# Patient Record
Sex: Female | Born: 1940 | Race: White | Hispanic: No | Marital: Married | Smoking: Never smoker
Health system: Southern US, Community
[De-identification: ages and names within clinical notes are randomized; demographics above are authoritative.]

## PROBLEM LIST (undated history)

## (undated) DIAGNOSIS — E7800 Pure hypercholesterolemia, unspecified: Secondary | ICD-10-CM

## (undated) DIAGNOSIS — T7840XA Allergy, unspecified, initial encounter: Secondary | ICD-10-CM

## (undated) DIAGNOSIS — Z5189 Encounter for other specified aftercare: Secondary | ICD-10-CM

## (undated) DIAGNOSIS — I10 Essential (primary) hypertension: Secondary | ICD-10-CM

## (undated) DIAGNOSIS — I639 Cerebral infarction, unspecified: Secondary | ICD-10-CM

## (undated) DIAGNOSIS — M810 Age-related osteoporosis without current pathological fracture: Secondary | ICD-10-CM

## (undated) HISTORY — DX: Essential (primary) hypertension: I10

## (undated) HISTORY — DX: Allergy, unspecified, initial encounter: T78.40XA

## (undated) HISTORY — DX: Encounter for other specified aftercare: Z51.89

## (undated) HISTORY — DX: Age-related osteoporosis without current pathological fracture: M81.0

## (undated) HISTORY — PX: VAGINAL HYSTERECTOMY: SUR661

---
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
## 1961-02-11 HISTORY — PX: APPENDECTOMY: SHX54

## 1972-02-12 HISTORY — PX: TUBAL LIGATION: SHX77

## 1997-09-02 ENCOUNTER — Ambulatory Visit (HOSPITAL_COMMUNITY): Admission: RE | Admit: 1997-09-02 | Discharge: 1997-09-02 | Payer: Self-pay | Admitting: Obstetrics & Gynecology

## 1998-09-05 ENCOUNTER — Ambulatory Visit (HOSPITAL_COMMUNITY): Admission: RE | Admit: 1998-09-05 | Discharge: 1998-09-05 | Payer: Self-pay | Admitting: Obstetrics & Gynecology

## 1998-09-05 ENCOUNTER — Encounter: Payer: Self-pay | Admitting: Obstetrics & Gynecology

## 1998-10-20 ENCOUNTER — Other Ambulatory Visit: Admission: RE | Admit: 1998-10-20 | Discharge: 1998-10-20 | Payer: Self-pay | Admitting: Obstetrics & Gynecology

## 1998-10-26 ENCOUNTER — Encounter: Payer: Self-pay | Admitting: Obstetrics & Gynecology

## 1998-10-26 ENCOUNTER — Ambulatory Visit (HOSPITAL_COMMUNITY): Admission: RE | Admit: 1998-10-26 | Discharge: 1998-10-26 | Payer: Self-pay | Admitting: Obstetrics & Gynecology

## 1999-09-18 ENCOUNTER — Encounter: Payer: Self-pay | Admitting: Obstetrics & Gynecology

## 1999-09-18 ENCOUNTER — Ambulatory Visit (HOSPITAL_COMMUNITY): Admission: RE | Admit: 1999-09-18 | Discharge: 1999-09-18 | Payer: Self-pay | Admitting: Obstetrics & Gynecology

## 1999-11-09 ENCOUNTER — Other Ambulatory Visit: Admission: RE | Admit: 1999-11-09 | Discharge: 1999-11-09 | Payer: Self-pay | Admitting: Obstetrics & Gynecology

## 2000-09-22 ENCOUNTER — Ambulatory Visit (HOSPITAL_COMMUNITY): Admission: RE | Admit: 2000-09-22 | Discharge: 2000-09-22 | Payer: Self-pay | Admitting: Obstetrics & Gynecology

## 2000-09-22 ENCOUNTER — Encounter: Payer: Self-pay | Admitting: Obstetrics & Gynecology

## 2001-04-08 ENCOUNTER — Other Ambulatory Visit: Admission: RE | Admit: 2001-04-08 | Discharge: 2001-04-08 | Payer: Self-pay | Admitting: Obstetrics & Gynecology

## 2002-07-19 ENCOUNTER — Other Ambulatory Visit: Admission: RE | Admit: 2002-07-19 | Discharge: 2002-07-19 | Payer: Self-pay | Admitting: Obstetrics & Gynecology

## 2003-07-22 ENCOUNTER — Other Ambulatory Visit: Admission: RE | Admit: 2003-07-22 | Discharge: 2003-07-22 | Payer: Self-pay | Admitting: Obstetrics & Gynecology

## 2004-10-26 ENCOUNTER — Ambulatory Visit (HOSPITAL_COMMUNITY): Admission: RE | Admit: 2004-10-26 | Discharge: 2004-10-26 | Payer: Self-pay | Admitting: Obstetrics & Gynecology

## 2004-10-26 ENCOUNTER — Other Ambulatory Visit: Admission: RE | Admit: 2004-10-26 | Discharge: 2004-10-26 | Payer: Self-pay | Admitting: Obstetrics & Gynecology

## 2006-01-29 ENCOUNTER — Ambulatory Visit (HOSPITAL_COMMUNITY): Admission: RE | Admit: 2006-01-29 | Discharge: 2006-01-29 | Payer: Self-pay | Admitting: Obstetrics & Gynecology

## 2010-10-19 ENCOUNTER — Ambulatory Visit
Admission: RE | Admit: 2010-10-19 | Discharge: 2010-10-19 | Disposition: A | Discharge status: Home / Self Care | Payer: Medicare Other | Source: Ambulatory Visit | Attending: Family Medicine | Admitting: Family Medicine

## 2010-10-19 ENCOUNTER — Other Ambulatory Visit: Payer: Self-pay | Admitting: Family Medicine

## 2010-10-19 DIAGNOSIS — J40 Bronchitis, not specified as acute or chronic: Secondary | ICD-10-CM

## 2010-10-19 DIAGNOSIS — R05 Cough: Secondary | ICD-10-CM

## 2011-05-03 DIAGNOSIS — J309 Allergic rhinitis, unspecified: Secondary | ICD-10-CM | POA: Diagnosis not present

## 2011-05-03 DIAGNOSIS — I10 Essential (primary) hypertension: Secondary | ICD-10-CM | POA: Diagnosis not present

## 2011-05-03 DIAGNOSIS — B9789 Other viral agents as the cause of diseases classified elsewhere: Secondary | ICD-10-CM | POA: Diagnosis not present

## 2011-05-13 DIAGNOSIS — R030 Elevated blood-pressure reading, without diagnosis of hypertension: Secondary | ICD-10-CM | POA: Diagnosis not present

## 2011-05-13 DIAGNOSIS — B9789 Other viral agents as the cause of diseases classified elsewhere: Secondary | ICD-10-CM | POA: Diagnosis not present

## 2011-05-31 DIAGNOSIS — I10 Essential (primary) hypertension: Secondary | ICD-10-CM | POA: Diagnosis not present

## 2011-05-31 DIAGNOSIS — R5383 Other fatigue: Secondary | ICD-10-CM | POA: Diagnosis not present

## 2011-05-31 DIAGNOSIS — R5381 Other malaise: Secondary | ICD-10-CM | POA: Diagnosis not present

## 2011-05-31 DIAGNOSIS — R05 Cough: Secondary | ICD-10-CM | POA: Diagnosis not present

## 2011-06-05 DIAGNOSIS — R6889 Other general symptoms and signs: Secondary | ICD-10-CM | POA: Diagnosis not present

## 2011-06-05 DIAGNOSIS — D649 Anemia, unspecified: Secondary | ICD-10-CM | POA: Diagnosis not present

## 2011-06-19 DIAGNOSIS — E7800 Pure hypercholesterolemia, unspecified: Secondary | ICD-10-CM | POA: Diagnosis not present

## 2011-06-19 DIAGNOSIS — I10 Essential (primary) hypertension: Secondary | ICD-10-CM | POA: Diagnosis not present

## 2011-06-19 DIAGNOSIS — S9030XA Contusion of unspecified foot, initial encounter: Secondary | ICD-10-CM | POA: Diagnosis not present

## 2011-09-21 DIAGNOSIS — M359 Systemic involvement of connective tissue, unspecified: Secondary | ICD-10-CM | POA: Diagnosis not present

## 2011-11-18 DIAGNOSIS — E7800 Pure hypercholesterolemia, unspecified: Secondary | ICD-10-CM | POA: Diagnosis not present

## 2011-11-18 DIAGNOSIS — M79609 Pain in unspecified limb: Secondary | ICD-10-CM | POA: Diagnosis not present

## 2011-11-18 DIAGNOSIS — Z23 Encounter for immunization: Secondary | ICD-10-CM | POA: Diagnosis not present

## 2011-12-06 DIAGNOSIS — J0100 Acute maxillary sinusitis, unspecified: Secondary | ICD-10-CM | POA: Diagnosis not present

## 2011-12-06 DIAGNOSIS — J069 Acute upper respiratory infection, unspecified: Secondary | ICD-10-CM | POA: Diagnosis not present

## 2012-07-24 DIAGNOSIS — I10 Essential (primary) hypertension: Secondary | ICD-10-CM | POA: Diagnosis not present

## 2012-07-24 DIAGNOSIS — E7800 Pure hypercholesterolemia, unspecified: Secondary | ICD-10-CM | POA: Diagnosis not present

## 2012-10-14 DIAGNOSIS — I10 Essential (primary) hypertension: Secondary | ICD-10-CM | POA: Diagnosis not present

## 2012-10-14 DIAGNOSIS — R232 Flushing: Secondary | ICD-10-CM | POA: Diagnosis not present

## 2012-10-14 DIAGNOSIS — E7800 Pure hypercholesterolemia, unspecified: Secondary | ICD-10-CM | POA: Diagnosis not present

## 2013-01-26 DIAGNOSIS — N959 Unspecified menopausal and perimenopausal disorder: Secondary | ICD-10-CM | POA: Diagnosis not present

## 2013-01-26 DIAGNOSIS — Z124 Encounter for screening for malignant neoplasm of cervix: Secondary | ICD-10-CM | POA: Diagnosis not present

## 2013-01-26 DIAGNOSIS — M810 Age-related osteoporosis without current pathological fracture: Secondary | ICD-10-CM | POA: Diagnosis not present

## 2013-01-26 DIAGNOSIS — Z130 Encounter for screening for diseases of the blood and blood-forming organs and certain disorders involving the immune mechanism: Secondary | ICD-10-CM | POA: Diagnosis not present

## 2013-01-26 DIAGNOSIS — Z1212 Encounter for screening for malignant neoplasm of rectum: Secondary | ICD-10-CM | POA: Diagnosis not present

## 2013-01-26 DIAGNOSIS — Z1231 Encounter for screening mammogram for malignant neoplasm of breast: Secondary | ICD-10-CM | POA: Diagnosis not present

## 2013-02-01 ENCOUNTER — Encounter: Payer: Self-pay | Admitting: Internal Medicine

## 2013-03-10 ENCOUNTER — Ambulatory Visit (AMBULATORY_SURGERY_CENTER): Payer: Self-pay | Admitting: *Deleted

## 2013-03-10 VITALS — Ht 62.5 in | Wt 133.4 lb

## 2013-03-10 DIAGNOSIS — Z1211 Encounter for screening for malignant neoplasm of colon: Secondary | ICD-10-CM

## 2013-03-10 MED ORDER — MOVIPREP 100 G PO SOLR: ORAL | Status: DC

## 2013-03-10 NOTE — Progress Notes (Signed)
No allergies to eggs or soy. No problems with anesthesia.  

## 2013-03-16 ENCOUNTER — Encounter: Payer: Self-pay | Admitting: Internal Medicine

## 2013-03-24 ENCOUNTER — Ambulatory Visit (AMBULATORY_SURGERY_CENTER): Payer: Medicare Other | Admitting: Internal Medicine

## 2013-03-24 ENCOUNTER — Encounter: Payer: Self-pay | Admitting: Internal Medicine

## 2013-03-24 VITALS — BP 143/94 | HR 53 | Temp 98.7°F | Resp 18 | Ht 62.0 in | Wt 133.0 lb

## 2013-03-24 DIAGNOSIS — D126 Benign neoplasm of colon, unspecified: Secondary | ICD-10-CM

## 2013-03-24 DIAGNOSIS — Z1211 Encounter for screening for malignant neoplasm of colon: Secondary | ICD-10-CM

## 2013-03-24 MED ORDER — SODIUM CHLORIDE 0.9 % IV SOLN: 500.0000 mL | INTRAVENOUS | Status: DC

## 2013-03-24 NOTE — Op Note (Signed)
[facility]  [address] [place], [id number]   COLONOSCOPY PROCEDURE REPORT  PATIENT: Kimberly, Woods  MR#: [id number] BIRTHDATE: [birth year] , 32  yrs. old GENDER: Female ENDOSCOPIST: Lafayette Dragon, MD REFERRED UU:VOZDGU Nori Riis, M.D. PROCEDURE DATE:  03/24/2013 PROCEDURE:   Colonoscopy, screening First Screening Colonoscopy - Avg.  risk and is 50 yrs.  old or older - No.  Prior Negative Screening - Now for repeat screening. 10 or more years since last screening  History of Adenoma - Now for follow-up colonoscopy & has been > or = to 3 yrs.  N/A  Polyps Removed Today? Yes. ASA CLASS:   Class II INDICATIONS:Average risk patient for colon cancer and last screening colonoscopy approximately 10 years ago was normal. MEDICATIONS: MAC sedation, administered by CRNA and propofol (Diprivan) 300mg  IV  DESCRIPTION OF PROCEDURE:   After the risks benefits and alternatives of the procedure were thoroughly explained, informed consent was obtained.  A digital rectal exam revealed no abnormalities of the rectum.   The LB [id number]  endoscope was introduced through the anus and advanced to the cecum, which was identified by both the appendix and ileocecal valve. No adverse events experienced.   The quality of the prep was good, using MoviPrep  The instrument was then slowly withdrawn as the colon was fully examined.      COLON FINDINGS: A diminutive sessile polyp was found in the ascending colon.  A polypectomy was performed with cold forceps. The resection was complete and the polyp tissue was completely retrieved.  Retroflexed views revealed no abnormalities. The time to cecum=7 minutes 09 seconds.  Withdrawal time=7 minutes 54 seconds.  The scope was withdrawn and the procedure completed. COMPLICATIONS: There were no complications.  ENDOSCOPIC IMPRESSION: Diminutive sessile polyp was found in the ascending colon; polypectomy was performed with cold  forceps  RECOMMENDATIONS: 1.  Await pathology results 2.  hhigh fiber diet Recall colonoscopy pending path report   eSigned:  Lafayette Dragon, MD 03/24/2013 3:26 PM   cc:   PATIENT NAME:  Kimberly, Woods MR#: [id number]

## 2013-03-24 NOTE — Patient Instructions (Signed)
YOU HAD AN ENDOSCOPIC PROCEDURE TODAY AT THE [facility]: Refer to the procedure report that was given to you for any specific questions about what was found during the examination.  If the procedure report does not answer your questions, please call your gastroenterologist to clarify.  If you requested that your care partner not be given the details of your procedure findings, then the procedure report has been included in a sealed envelope for you to review at your convenience later.  YOU SHOULD EXPECT: Some feelings of bloating in the abdomen. Passage of more gas than usual.  Walking can help get rid of the air that was put into your GI tract during the procedure and reduce the bloating. If you had a lower endoscopy (such as a colonoscopy or flexible sigmoidoscopy) you may notice spotting of blood in your stool or on the toilet paper. If you underwent a bowel prep for your procedure, then you may not have a normal bowel movement for a few days.  DIET: Your first meal following the procedure should be a light meal and then it is ok to progress to your normal diet.  A half-sandwich or bowl of soup is an example of a good first meal.  Heavy or fried foods are harder to digest and may make you feel nauseous or bloated.  Likewise meals heavy in dairy and vegetables can cause extra gas to form and this can also increase the bloating.  Drink plenty of fluids but you should avoid alcoholic beverages for 24 hours.  ACTIVITY: Your care partner should take you home directly after the procedure.  You should plan to take it easy, moving slowly for the rest of the day.  You can resume normal activity the day after the procedure however you should NOT DRIVE or use heavy machinery for 24 hours (because of the sedation medicines used during the test).    SYMPTOMS TO REPORT IMMEDIATELY: A gastroenterologist can be reached at any hour.  During normal business hours, 8:30 AM to 5:00 PM Monday through Friday,  call [phone].  After hours and on weekends, please call the GI answering service at [phone] who will take a message and have the physician on call contact you.   Following lower endoscopy (colonoscopy or flexible sigmoidoscopy):  Excessive amounts of blood in the stool  Significant tenderness or worsening of abdominal pains  Swelling of the abdomen that is new, acute  Fever of 100F or higher    FOLLOW UP: If any biopsies were taken you will be contacted by phone or by letter within the next 1-3 weeks.  Call your gastroenterologist if you have not heard about the biopsies in 3 weeks.  Our staff will call the home number listed on your records the next business day following your procedure to check on you and address any questions or concerns that you may have at that time regarding the information given to you following your procedure. This is a courtesy call and so if there is no answer at the home number and we have not heard from you through the emergency physician on call, we will assume that you have returned to your regular daily activities without incident.  SIGNATURES/CONFIDENTIALITY: You and/or your care partner have signed paperwork which will be entered into your electronic medical record.  These signatures attest to the fact that that the information above on your After Visit Summary has been reviewed and is understood.  Full responsibility of the confidentiality   of this discharge information lies with you and/or your care-partner.   INFORMATION ON POLYPS AND HIGH FIBER DIET GIVEN TO YOU TODAY 

## 2013-03-24 NOTE — Progress Notes (Signed)
Called to room to assist during endoscopic procedure.  Patient ID and intended procedure confirmed with present staff. Received instructions for my participation in the procedure from the performing physician.  

## 2013-03-25 ENCOUNTER — Telehealth: Payer: Self-pay | Admitting: *Deleted

## 2013-03-25 NOTE — Telephone Encounter (Signed)
  Follow up Call-  Call back number 03/24/2013  Post procedure Call Back phone  # [phone]  Permission to leave phone message Yes     Patient questions:  Do you have a fever, pain , or abdominal swelling? no Pain Score  0 *  Have you tolerated food without any problems? yes  Have you been able to return to your normal activities? yes  Do you have any questions about your discharge instructions: Diet   no Medications  no Follow up visit  no  Do you have questions or concerns about your Care? no  Actions: * If pain score is 4 or above: No action needed, pain <4.

## 2013-03-29 ENCOUNTER — Encounter: Payer: Self-pay | Admitting: Internal Medicine

## 2013-03-31 ENCOUNTER — Encounter: Payer: Self-pay | Admitting: *Deleted

## 2013-04-20 ENCOUNTER — Ambulatory Visit (HOSPITAL_COMMUNITY)
Admission: RE | Admit: 2013-04-20 | Discharge: 2013-04-20 | Disposition: A | Discharge status: Home / Self Care | Payer: Medicare Other | Source: Ambulatory Visit | Attending: *Deleted | Admitting: *Deleted

## 2013-04-20 ENCOUNTER — Other Ambulatory Visit (HOSPITAL_COMMUNITY): Payer: Self-pay | Admitting: *Deleted

## 2013-04-20 DIAGNOSIS — W19XXXA Unspecified fall, initial encounter: Secondary | ICD-10-CM

## 2013-04-20 DIAGNOSIS — J9819 Other pulmonary collapse: Secondary | ICD-10-CM | POA: Insufficient documentation

## 2013-04-20 DIAGNOSIS — R079 Chest pain, unspecified: Secondary | ICD-10-CM | POA: Insufficient documentation

## 2013-04-20 DIAGNOSIS — I10 Essential (primary) hypertension: Secondary | ICD-10-CM | POA: Insufficient documentation

## 2013-10-20 MED ADMIN — LORazepam (ATIVAN) tablet 0.5 mg: ORAL | @ 18:00:00 | NDC 68084073611

## 2013-10-20 MED FILL — LORAZEPAM 0.5 MG TAB: 0.5 mg | ORAL | Qty: 1

## 2013-10-20 NOTE — ED Notes (Signed)
Discharged by Provider.

## 2013-10-20 NOTE — ED Provider Notes (Signed)
HPI Comments: Pt reports that 2 nights ago her granddaughter "popped" up in bed and caused her to fall back and hit her right occiput on the bedframe. No LOC. Last night, pt was in the bathroom and slipped on spilt toilet water (from plunging) and his her forehead on the bathtub.  Today pt has a vague visual disturbance in R>L eye. Denies blurry vision or FB. States she sees a reflection of herself just in front of her right eye. Even when her eye is closed    Patient is a 73 y.o. female presenting with head injury. The history is provided by the patient.   Head Injury   The incident occurred more than 2 days ago. She came to the ER via walk-in. The injury mechanism was a direct blow. The volume of blood lost was none. The quality of the pain is described as throbbing. The pain is mild. Pertinent negatives include no numbness and no weakness. Associated symptoms comments: Visual changes. She has tried nothing for the symptoms. There was no loss of consciousness.        Past Medical History   Diagnosis Date   ??? Hypertension         Past Surgical History   Procedure Laterality Date   ??? Hx hysteroscopy with endometrial ablation     ??? Hx tubal ligation     ??? Hx appendectomy     ??? Hx heent       tonsillectomy         No family history on file.     History     Social History   ??? Marital Status: MARRIED     Spouse Name: N/A     Number of Children: N/A   ??? Years of Education: N/A     Occupational History   ??? Not on file.     Social History Main Topics   ??? Smoking status: Not on file   ??? Smokeless tobacco: Not on file   ??? Alcohol Use: Not on file   ??? Drug Use: Not on file   ??? Sexual Activity: Not on file     Other Topics Concern   ??? Not on file     Social History Narrative   ??? No narrative on file                  ALLERGIES: Review of patient's allergies indicates no known allergies.      Review of Systems   Constitutional: Positive for activity change. Negative for fever, chills, diaphoresis, appetite change and fatigue.    HENT: Positive for facial swelling. Negative for congestion, dental problem, ear discharge and ear pain.    Eyes: Positive for visual disturbance. Negative for photophobia, pain, discharge, redness and itching.   Respiratory: Negative for cough, shortness of breath and stridor.    Cardiovascular: Negative for chest pain, palpitations and leg swelling.   Gastrointestinal: Negative.    Genitourinary: Negative.    Musculoskeletal: Negative.    Neurological: Positive for headaches. Negative for dizziness, tremors, seizures, speech difficulty, weakness, light-headedness and numbness.       Filed Vitals:    10/20/13 1159 10/20/13 1200 10/20/13 1201   BP: 206/124 186/99 186/99   Pulse: 71     Temp:   98.2 ??F (36.8 ??C)   Resp: 16     Height:  (1.575 m)     Weight: 58.968 kg (130 lb)     SpO2: 96% 98%  Physical Exam   Constitutional: She is oriented to person, place, and time. She appears well-developed and well-nourished. No distress.   HENT:   Head: Normocephalic and atraumatic.   Right Ear: External ear normal.   Left Ear: External ear normal.   Mouth/Throat: Oropharynx is clear and moist. No oropharyngeal exudate.   Eyes: Conjunctivae are normal. Pupils are equal, round, and reactive to light. Right eye exhibits no discharge. Left eye exhibits no discharge.   Fundoscopic exam:       The right eye shows no AV nicking, no exudate, no hemorrhage and no papilledema.        The left eye shows no AV nicking, no exudate, no hemorrhage and no papilledema.   Neck: Normal range of motion.   Cardiovascular: Normal rate, regular rhythm and normal heart sounds.    No murmur heard.  Pulmonary/Chest: Effort normal and breath sounds normal. No respiratory distress. She has no wheezes. She has no rales.   Neurological: She is alert and oriented to person, place, and time.   Skin: Skin is warm and dry. She is not diaphoretic.   Psychiatric: Her mood appears anxious.        MDM    Procedures     D/W Attending. Agrees with plan.    3:05 pm Daryl from [facility] returned call. He decided that he needed to call on call physician and then would call back again      Daryl was paged and called back a total of 3 times between 1:15 and 4:10. He spoke to Dr Maren Reamer, then myself, then [place]. No appointment made. Spoke to main call center about 4:20 and in 5 minutes an appt for tomorrow at 4:10pm was made with Dr. Anabel Bene at [place] office.        CLINICAL IMPRESSION:  1. Visual changes    2. CHI (closed head injury), initial encounter          Plan  1.F/U with ophthalmology  Return to ED if sx's worsen    DISCHARGE NOTE:  7:52 PM  Octavio Matheney A. Delorise Royals, [place] spoke with Dianna Rossetti about sx, dx, tx, and rx with good understanding. Care plan outlined and precautions discussed. All pt???s questions and concerns were addressed.  Pt was instructed and agrees to f/u with Dr. Anabel Bene, as well as to return to ED upon further deterioration. Pt is ready to go home.

## 2013-10-20 NOTE — ED Notes (Signed)
Pt resting in room, pt alert and oriented x3, vs stable as noted, pt has no new complaints at this time. Call bell within reach, bed in low position and locked. Pt aware to call the RN on the call bell if needs anything. Pt verbalizes understanding.

## 2013-10-20 NOTE — ED Notes (Signed)
Pt instructed to follow up with PCP about BP. PA Carrie aware of pt's BP and states pt is okay to be discharged.

## 2013-10-20 NOTE — ED Notes (Addendum)
Head impact x2 within last two days.  No LOC reported.  Intermittent blurred vision since.

## 2013-10-20 NOTE — Other (Signed)
Chart accessed for order verification, acuity or bed planning disposition.

## 2013-10-20 NOTE — ED Notes (Signed)
Pt ambulated to restroom with minimal assistance, tolerated activity well, report back to Primary RN Corrie.

## 2014-01-27 DIAGNOSIS — Z1231 Encounter for screening mammogram for malignant neoplasm of breast: Secondary | ICD-10-CM | POA: Diagnosis not present

## 2014-02-16 DIAGNOSIS — I10 Essential (primary) hypertension: Secondary | ICD-10-CM | POA: Diagnosis not present

## 2014-02-16 DIAGNOSIS — R7301 Impaired fasting glucose: Secondary | ICD-10-CM | POA: Diagnosis not present

## 2014-02-16 DIAGNOSIS — E782 Mixed hyperlipidemia: Secondary | ICD-10-CM | POA: Diagnosis not present

## 2014-08-25 DIAGNOSIS — R7301 Impaired fasting glucose: Secondary | ICD-10-CM | POA: Diagnosis not present

## 2014-08-25 DIAGNOSIS — I10 Essential (primary) hypertension: Secondary | ICD-10-CM | POA: Diagnosis not present

## 2014-09-16 DIAGNOSIS — L259 Unspecified contact dermatitis, unspecified cause: Secondary | ICD-10-CM | POA: Diagnosis not present

## 2014-10-04 ENCOUNTER — Ambulatory Visit (HOSPITAL_COMMUNITY)
Admission: RE | Admit: 2014-10-04 | Discharge: 2014-10-04 | Disposition: A | Discharge status: Home / Self Care | Payer: Medicare Other | Source: Ambulatory Visit | Attending: Internal Medicine | Admitting: Internal Medicine

## 2014-10-04 ENCOUNTER — Other Ambulatory Visit (HOSPITAL_COMMUNITY): Payer: Self-pay | Admitting: Internal Medicine

## 2014-10-04 DIAGNOSIS — M79652 Pain in left thigh: Secondary | ICD-10-CM | POA: Diagnosis not present

## 2014-10-04 DIAGNOSIS — M79605 Pain in left leg: Secondary | ICD-10-CM | POA: Diagnosis not present

## 2014-10-04 DIAGNOSIS — R52 Pain, unspecified: Secondary | ICD-10-CM

## 2014-10-04 DIAGNOSIS — R609 Edema, unspecified: Secondary | ICD-10-CM

## 2014-10-04 DIAGNOSIS — M25562 Pain in left knee: Secondary | ICD-10-CM | POA: Insufficient documentation

## 2014-12-26 DIAGNOSIS — J019 Acute sinusitis, unspecified: Secondary | ICD-10-CM | POA: Diagnosis not present

## 2015-01-24 DIAGNOSIS — I10 Essential (primary) hypertension: Secondary | ICD-10-CM | POA: Diagnosis not present

## 2015-01-24 DIAGNOSIS — E782 Mixed hyperlipidemia: Secondary | ICD-10-CM | POA: Diagnosis not present

## 2015-01-24 DIAGNOSIS — R7301 Impaired fasting glucose: Secondary | ICD-10-CM | POA: Diagnosis not present

## 2015-01-31 DIAGNOSIS — E782 Mixed hyperlipidemia: Secondary | ICD-10-CM | POA: Diagnosis not present

## 2015-01-31 DIAGNOSIS — I10 Essential (primary) hypertension: Secondary | ICD-10-CM | POA: Diagnosis not present

## 2015-01-31 DIAGNOSIS — R7301 Impaired fasting glucose: Secondary | ICD-10-CM | POA: Diagnosis not present

## 2015-02-08 DIAGNOSIS — Z6823 Body mass index (BMI) 23.0-23.9, adult: Secondary | ICD-10-CM | POA: Diagnosis not present

## 2015-02-08 DIAGNOSIS — Z01419 Encounter for gynecological examination (general) (routine) without abnormal findings: Secondary | ICD-10-CM | POA: Diagnosis not present

## 2015-02-08 DIAGNOSIS — Z1272 Encounter for screening for malignant neoplasm of vagina: Secondary | ICD-10-CM | POA: Diagnosis not present

## 2015-02-08 DIAGNOSIS — Z124 Encounter for screening for malignant neoplasm of cervix: Secondary | ICD-10-CM | POA: Diagnosis not present

## 2015-02-08 DIAGNOSIS — Z1231 Encounter for screening mammogram for malignant neoplasm of breast: Secondary | ICD-10-CM | POA: Diagnosis not present

## 2015-03-29 DIAGNOSIS — R05 Cough: Secondary | ICD-10-CM | POA: Diagnosis not present

## 2015-03-29 DIAGNOSIS — J00 Acute nasopharyngitis [common cold]: Secondary | ICD-10-CM | POA: Diagnosis not present

## 2015-04-11 DIAGNOSIS — R05 Cough: Secondary | ICD-10-CM | POA: Diagnosis not present

## 2015-04-11 DIAGNOSIS — R5383 Other fatigue: Secondary | ICD-10-CM | POA: Diagnosis not present

## 2015-04-11 DIAGNOSIS — J00 Acute nasopharyngitis [common cold]: Secondary | ICD-10-CM | POA: Diagnosis not present

## 2015-08-08 DIAGNOSIS — E782 Mixed hyperlipidemia: Secondary | ICD-10-CM | POA: Diagnosis not present

## 2015-08-08 DIAGNOSIS — R7301 Impaired fasting glucose: Secondary | ICD-10-CM | POA: Diagnosis not present

## 2015-08-08 DIAGNOSIS — I10 Essential (primary) hypertension: Secondary | ICD-10-CM | POA: Diagnosis not present

## 2015-08-10 DIAGNOSIS — I10 Essential (primary) hypertension: Secondary | ICD-10-CM | POA: Diagnosis not present

## 2015-08-10 DIAGNOSIS — E782 Mixed hyperlipidemia: Secondary | ICD-10-CM | POA: Diagnosis not present

## 2015-08-10 DIAGNOSIS — R7301 Impaired fasting glucose: Secondary | ICD-10-CM | POA: Diagnosis not present

## 2015-10-12 ENCOUNTER — Other Ambulatory Visit: Payer: Self-pay

## 2016-01-23 DIAGNOSIS — E782 Mixed hyperlipidemia: Secondary | ICD-10-CM | POA: Diagnosis not present

## 2016-01-23 DIAGNOSIS — I10 Essential (primary) hypertension: Secondary | ICD-10-CM | POA: Diagnosis not present

## 2016-01-23 DIAGNOSIS — R7301 Impaired fasting glucose: Secondary | ICD-10-CM | POA: Diagnosis not present

## 2016-01-27 DIAGNOSIS — E782 Mixed hyperlipidemia: Secondary | ICD-10-CM | POA: Diagnosis not present

## 2016-01-27 DIAGNOSIS — I10 Essential (primary) hypertension: Secondary | ICD-10-CM | POA: Diagnosis not present

## 2016-01-27 DIAGNOSIS — R7301 Impaired fasting glucose: Secondary | ICD-10-CM | POA: Diagnosis not present

## 2016-02-12 DIAGNOSIS — E7800 Pure hypercholesterolemia, unspecified: Secondary | ICD-10-CM

## 2016-02-12 HISTORY — DX: Pure hypercholesterolemia, unspecified: E78.00

## 2016-05-12 DIAGNOSIS — I639 Cerebral infarction, unspecified: Secondary | ICD-10-CM

## 2016-05-12 HISTORY — DX: Cerebral infarction, unspecified: I63.9

## 2016-06-05 ENCOUNTER — Encounter (HOSPITAL_COMMUNITY): Payer: Self-pay | Admitting: Emergency Medicine

## 2016-06-05 ENCOUNTER — Inpatient Hospital Stay (HOSPITAL_COMMUNITY)
Admission: EM | Admit: 2016-06-05 | Discharge: 2016-06-07 | DRG: 065 | Disposition: A | Discharge status: Home / Self Care | Payer: Medicare Other | Attending: Internal Medicine | Admitting: Internal Medicine

## 2016-06-05 ENCOUNTER — Emergency Department (HOSPITAL_COMMUNITY): Payer: Medicare Other

## 2016-06-05 DIAGNOSIS — I10 Essential (primary) hypertension: Secondary | ICD-10-CM | POA: Diagnosis present

## 2016-06-05 DIAGNOSIS — M810 Age-related osteoporosis without current pathological fracture: Secondary | ICD-10-CM | POA: Diagnosis present

## 2016-06-05 DIAGNOSIS — I639 Cerebral infarction, unspecified: Secondary | ICD-10-CM | POA: Diagnosis not present

## 2016-06-05 DIAGNOSIS — E785 Hyperlipidemia, unspecified: Secondary | ICD-10-CM | POA: Diagnosis present

## 2016-06-05 DIAGNOSIS — R531 Weakness: Secondary | ICD-10-CM | POA: Diagnosis not present

## 2016-06-05 DIAGNOSIS — G629 Polyneuropathy, unspecified: Secondary | ICD-10-CM | POA: Diagnosis present

## 2016-06-05 DIAGNOSIS — R29810 Facial weakness: Secondary | ICD-10-CM | POA: Diagnosis present

## 2016-06-05 DIAGNOSIS — G8194 Hemiplegia, unspecified affecting left nondominant side: Secondary | ICD-10-CM | POA: Diagnosis present

## 2016-06-05 DIAGNOSIS — Z7982 Long term (current) use of aspirin: Secondary | ICD-10-CM

## 2016-06-05 LAB — COMPREHENSIVE METABOLIC PANEL
ALBUMIN: 4.3 g/dL (ref 3.5–5.0)
ALT: 20 U/L (ref 14–54)
ANION GAP: 8 (ref 5–15)
AST: 24 U/L (ref 15–41)
Alkaline Phosphatase: 56 U/L (ref 38–126)
BILIRUBIN TOTAL: 0.7 mg/dL (ref 0.3–1.2)
BUN: 15 mg/dL (ref 6–20)
CHLORIDE: 101 mmol/L (ref 101–111)
CO2: 30 mmol/L (ref 22–32)
Calcium: 9.6 mg/dL (ref 8.9–10.3)
Creatinine, Ser: 0.6 mg/dL (ref 0.44–1.00)
GFR calc Af Amer: >60 mL/min (ref >60)
GFR calc non Af Amer: >60 mL/min (ref >60)
GLUCOSE: 102 mg/dL — AB (ref 65–99)
POTASSIUM: 3.8 mmol/L (ref 3.5–5.1)
SODIUM: 139 mmol/L (ref 135–145)
TOTAL PROTEIN: 6.8 g/dL (ref 6.5–8.1)

## 2016-06-05 LAB — DIFFERENTIAL
BASOS ABS: 0 10*3/uL (ref 0.0–0.1)
BASOS PCT: 0 %
EOS ABS: 0.4 10*3/uL (ref 0.0–0.7)
EOS PCT: 4 %
Lymphocytes Relative: 33 %
Lymphs Abs: 3 10*3/uL (ref 0.7–4.0)
Monocytes Absolute: 0.8 10*3/uL (ref 0.1–1.0)
Monocytes Relative: 9 %
Neutro Abs: 4.8 10*3/uL (ref 1.7–7.7)
Neutrophils Relative %: 54 %

## 2016-06-05 LAB — CBC
HCT: 41.6 % (ref 36.0–46.0)
HEMOGLOBIN: 13.7 g/dL (ref 12.0–15.0)
MCH: 30 pg (ref 26.0–34.0)
MCHC: 32.9 g/dL (ref 30.0–36.0)
MCV: 91 fL (ref 78.0–100.0)
PLATELETS: 139 10*3/uL — AB (ref 150–400)
RBC: 4.57 MIL/uL (ref 3.87–5.11)
RDW: 12.6 % (ref 11.5–15.5)
WBC: 9 10*3/uL (ref 4.0–10.5)

## 2016-06-05 LAB — CBG MONITORING, ED: GLUCOSE-CAPILLARY: 96 mg/dL (ref 65–99)

## 2016-06-05 LAB — URINALYSIS, ROUTINE W REFLEX MICROSCOPIC
Bacteria, UA: NONE SEEN
Bilirubin Urine: NEGATIVE
Glucose, UA: NEGATIVE mg/dL
HGB URINE DIPSTICK: NEGATIVE
Ketones, ur: NEGATIVE mg/dL
Nitrite: NEGATIVE
PH: 8 (ref 5.0–8.0)
Protein, ur: NEGATIVE mg/dL
SPECIFIC GRAVITY, URINE: 1.004 — AB (ref 1.005–1.030)
Squamous Epithelial / LPF: NONE SEEN

## 2016-06-05 LAB — ETHANOL: Alcohol, Ethyl (B): <5 mg/dL (ref <5)

## 2016-06-05 LAB — RAPID URINE DRUG SCREEN, HOSP PERFORMED
AMPHETAMINES: NOT DETECTED
BENZODIAZEPINES: NOT DETECTED
Barbiturates: NOT DETECTED
Cocaine: NOT DETECTED
Opiates: NOT DETECTED
TETRAHYDROCANNABINOL: NOT DETECTED

## 2016-06-05 LAB — PROTIME-INR
INR: 0.91
Prothrombin Time: 12.3 seconds (ref 11.4–15.2)

## 2016-06-05 LAB — APTT: aPTT: 24 seconds (ref 24–36)

## 2016-06-05 MED ORDER — STROKE: EARLY STAGES OF RECOVERY BOOK
Freq: Once | Status: AC
  Administered 2016-06-06: 08:00:00
  Filled 2016-06-05: qty 1

## 2016-06-05 MED ORDER — ACETAMINOPHEN 650 MG RE SUPP: 650.0000 mg | RECTAL | Status: DC | PRN

## 2016-06-05 MED ORDER — ATORVASTATIN CALCIUM 40 MG PO TABS
40.0000 mg | ORAL_TABLET | Freq: Every day | ORAL | Status: DC
  Administered 2016-06-05 – 2016-06-06 (×2): 40 mg via ORAL
  Filled 2016-06-05 (×2): qty 1

## 2016-06-05 MED ORDER — ASPIRIN 325 MG PO TABS
325.0000 mg | ORAL_TABLET | Freq: Every day | ORAL | Status: DC
  Administered 2016-06-05 – 2016-06-07 (×3): 325 mg via ORAL
  Filled 2016-06-05 (×3): qty 1

## 2016-06-05 MED ORDER — ACETAMINOPHEN 160 MG/5ML PO SOLN: 650.0000 mg | ORAL | Status: DC | PRN

## 2016-06-05 MED ORDER — SENNOSIDES-DOCUSATE SODIUM 8.6-50 MG PO TABS: 1.0000 | ORAL_TABLET | Freq: Every evening | ORAL | Status: DC | PRN

## 2016-06-05 MED ORDER — ACETAMINOPHEN 325 MG PO TABS: 650.0000 mg | ORAL_TABLET | ORAL | Status: DC | PRN

## 2016-06-05 MED ORDER — METOPROLOL TARTRATE 25 MG PO TABS
25.0000 mg | ORAL_TABLET | Freq: Two times a day (BID) | ORAL | Status: DC
  Administered 2016-06-05: 25 mg via ORAL
  Filled 2016-06-05 (×2): qty 1

## 2016-06-05 MED ORDER — ENOXAPARIN SODIUM 40 MG/0.4ML ~~LOC~~ SOLN
40.0000 mg | SUBCUTANEOUS | Status: DC
  Administered 2016-06-05 – 2016-06-06 (×2): 40 mg via SUBCUTANEOUS
  Filled 2016-06-05 (×2): qty 0.4

## 2016-06-05 MED ORDER — SODIUM CHLORIDE 0.9 % IV SOLN
INTRAVENOUS | Status: DC
  Administered 2016-06-05 – 2016-06-06 (×2): via INTRAVENOUS

## 2016-06-05 MED ORDER — OMEGA-3-6-9 PO CAPS: ORAL_CAPSULE | Freq: Two times a day (BID) | ORAL | Status: DC

## 2016-06-05 MED ORDER — CHOLECALCIFEROL 10 MCG (400 UNIT) PO TABS
400.0000 [IU] | ORAL_TABLET | Freq: Every day | ORAL | Status: DC
  Administered 2016-06-05 – 2016-06-07 (×3): 400 [IU] via ORAL
  Filled 2016-06-05 (×3): qty 1

## 2016-06-05 MED ORDER — OMEGA-3-ACID ETHYL ESTERS 1 G PO CAPS
1.0000 g | ORAL_CAPSULE | Freq: Two times a day (BID) | ORAL | Status: DC
  Administered 2016-06-05 – 2016-06-07 (×4): 1 g via ORAL
  Filled 2016-06-05 (×4): qty 1

## 2016-06-05 MED ORDER — ASPIRIN 300 MG RE SUPP: 300.0000 mg | Freq: Every day | RECTAL | Status: DC

## 2016-06-05 MED ORDER — VITAMIN C 500 MG PO TABS
1000.0000 mg | ORAL_TABLET | Freq: Two times a day (BID) | ORAL | Status: DC
  Administered 2016-06-05 – 2016-06-07 (×4): 1000 mg via ORAL
  Filled 2016-06-05 (×4): qty 2

## 2016-06-05 MED ORDER — CLOPIDOGREL BISULFATE 75 MG PO TABS
75.0000 mg | ORAL_TABLET | Freq: Every day | ORAL | Status: DC
Start: 2016-06-05 — End: 2016-06-07
  Administered 2016-06-05 – 2016-06-07 (×3): 75 mg via ORAL
  Filled 2016-06-05 (×3): qty 1

## 2016-06-05 NOTE — ED Notes (Signed)
Assisted patient to restroom with standby assist. Patient given yellow grip socks.

## 2016-06-05 NOTE — ED Triage Notes (Signed)
Pt reports waking up feeling dizzy this morning.  States she did not feel right while making breakfast and thought she began leaning to the left around 0800.

## 2016-06-05 NOTE — ED Notes (Addendum)
Attempted blood with IV stick, but unsuccessful with blood draw. Troponin and Chem 8 not drawn or completed. Need lab to do it.

## 2016-06-05 NOTE — ED Provider Notes (Addendum)
[facility] DEPT Provider Note   CSN: [id number] Arrival date & time: 06/05/16  1710     History   Chief Complaint Chief Complaint  Patient presents with  . Weakness    HPI Kimberly Woods is a 76 y.o. female.   Weakness  Primary symptoms include focal weakness, loss of balance, speech change. This is a new problem. The current episode started 6 to 12 hours ago. The problem has been gradually improving. There was left facial, left upper extremity and left lower extremity focality noted. Pertinent negatives include no shortness of breath and no chest pain. There were no medications administered prior to arrival. Associated medical issues do not include trauma or seizures.    Past Medical History:  Diagnosis Date  . Allergy   . Blood transfusion without reported diagnosis    after childbirth  . Hypertension   . Osteoporosis     Patient Active Problem List   Diagnosis Date Noted  . CVA (cerebral vascular accident) ([place]) 06/05/2016  . Hypertension 06/05/2016    Past Surgical History:  Procedure Laterality Date  . APPENDECTOMY  1963  . TONSILLECTOMY  1949  . TUBAL LIGATION  1974  . VAGINAL HYSTERECTOMY      OB History    No data available       Home Medications    Prior to Admission medications   Medication Sig Start Date End Date Taking? Authorizing Provider  aspirin 81 MG tablet Take 81 mg by mouth daily.    Yes Historical Provider, MD  cholecalciferol (VITAMIN D) 1000 units tablet Take 1,000 Units by mouth daily.   Yes Historical Provider, MD  Cyanocobalamin (B-12 PO) Take 1 tablet by mouth daily.   Yes Historical Provider, MD  losartan-hydrochlorothiazide (HYZAAR) 50-12.5 MG tablet Take 1 tablet by mouth daily.   Yes Historical Provider, MD  Multiple Minerals-Vitamins (GNP CAL MAG ZINC +D3) TABS Take 2 tablets by mouth 2 (two) times daily.   Yes Historical Provider, MD  Multiple Vitamin (MULTIVITAMIN WITH MINERALS) TABS tablet Take 1 tablet by mouth  daily.   Yes Historical Provider, MD  Omega 3-6-9 Fatty Acids (OMEGA-3-6-9 PO) Take by mouth 2 (two) times daily.   Yes Historical Provider, MD  TURMERIC PO Take 1 capsule by mouth daily.   Yes Historical Provider, MD  vitamin C (ASCORBIC ACID) 500 MG tablet Take 500 mg by mouth 2 (two) times daily.    Yes Historical Provider, MD    Family History Family History  Problem Relation Age of Onset  . Colon cancer Neg Hx     Social History Social History  Substance Use Topics  . Smoking status: Never Smoker  . Smokeless tobacco: Never Used  . Alcohol use No     Allergies   Patient has no known allergies.   Review of Systems Review of Systems  Respiratory: Negative for shortness of breath.   Cardiovascular: Negative for chest pain.  Neurological: Positive for speech change, focal weakness, weakness and loss of balance.  All other systems reviewed and are negative.    Physical Exam Updated Vital Signs BP (!) 178/96 (BP Location: Left Arm)   Pulse (!) 52   Temp 97.5 F (36.4 C) (Oral)   Resp 12   Ht 5\' 2"  (1.575 m)   Wt 130 lb 11.2 oz (59.3 kg)   SpO2 100%   BMI 23.91 kg/m   Physical Exam  Constitutional: She is oriented to person, place, and time. She appears well-developed and well-nourished.  HENT:  Head: Normocephalic and atraumatic.  Eyes: Conjunctivae and EOM are normal.  Neck: Normal range of motion.  Cardiovascular: Normal rate and regular rhythm.   Pulmonary/Chest: Effort normal. No stridor. No respiratory distress.  Abdominal: Soft. She exhibits no distension.  Musculoskeletal: Normal range of motion. She exhibits no edema or deformity.  Neurological: She is alert and oriented to person, place, and time. A cranial nerve deficit (mild left facial droop) is present. No sensory deficit. Coordination abnormal.  Decreased strength in right bicep, decreased ability to left leg off bed compared to right  Skin: Skin is warm and dry.  Nursing note and vitals  reviewed.    ED Treatments / Results  Labs (all labs ordered are listed, but only abnormal results are displayed) Labs Reviewed  CBC - Abnormal; Notable for the following:       Result Value   Platelets 139 (*)    All other components within normal limits  COMPREHENSIVE METABOLIC PANEL - Abnormal; Notable for the following:    Glucose, Bld 102 (*)    All other components within normal limits  URINALYSIS, ROUTINE W REFLEX MICROSCOPIC - Abnormal; Notable for the following:    Color, Urine STRAW (*)    Specific Gravity, Urine 1.004 (*)    Leukocytes, UA TRACE (*)    All other components within normal limits  ETHANOL  PROTIME-INR  APTT  DIFFERENTIAL  RAPID URINE DRUG SCREEN, HOSP PERFORMED  HEMOGLOBIN A1C  LIPID PANEL  I-STAT CHEM 8, ED  I-STAT TROPOININ, ED  CBG MONITORING, ED    EKG  EKG Interpretation None       Radiology Ct Head Wo Contrast  Result Date: 06/05/2016 CLINICAL DATA:  Acute onset of dizziness. Began leaning to the left this morning. Initial encounter. EXAM: CT HEAD WITHOUT CONTRAST TECHNIQUE: Contiguous axial images were obtained from the base of the skull through the vertex without intravenous contrast. COMPARISON:  None. FINDINGS: Brain: No evidence of acute infarction, hemorrhage, hydrocephalus, extra-axial collection or mass lesion/mass effect. Mild periventricular white matter change likely reflects small vessel ischemic microangiopathy. Cerebellar atrophy is noted. The posterior fossa, including the cerebellum, brainstem and fourth ventricle, is within normal limits. The third and lateral ventricles, and basal ganglia are unremarkable in appearance. The cerebral hemispheres are symmetric in appearance, with normal gray-white differentiation. No mass effect or midline shift is seen. Vascular: No hyperdense vessel or unexpected calcification. Skull: There is no evidence of fracture; visualized osseous structures are unremarkable in appearance.  Sinuses/Orbits: The visualized portions of the orbits are within normal limits. The paranasal sinuses and mastoid air cells are well-aerated. Other: No significant soft tissue abnormalities are seen. IMPRESSION: 1. No acute intracranial pathology seen on CT. 2. Mild small vessel ischemic microangiopathy. Electronically Signed   By: Garald Balding M.D.   On: 06/05/2016 18:37    Procedures Procedures (including critical care time)  Medications Ordered in ED Medications  metoprolol tartrate (LOPRESSOR) tablet 25 mg (not administered)  cholecalciferol (VITAMIN D) tablet 400 Units (not administered)  Omega-3-6-9 CAPS (not administered)  vitamin C (ASCORBIC ACID) tablet 500 mg (not administered)   stroke: mapping our early stages of recovery book (not administered)  0.9 %  sodium chloride infusion (not administered)  acetaminophen (TYLENOL) tablet 650 mg (not administered)    Or  acetaminophen (TYLENOL) solution 650 mg (not administered)    Or  acetaminophen (TYLENOL) suppository 650 mg (not administered)  senna-docusate (Senokot-S) tablet 1 tablet (not administered)  enoxaparin (LOVENOX) injection 40  mg (not administered)  aspirin suppository 300 mg (not administered)    Or  aspirin tablet 325 mg (not administered)  clopidogrel (PLAVIX) tablet 75 mg (not administered)  atorvastatin (LIPITOR) tablet 40 mg (not administered)     Initial Impression / Assessment and Plan / ED Course  I have reviewed the triage vital signs and the nursing notes.  Pertinent labs & imaging results that were available during my care of the patient were reviewed by me and considered in my medical decision making (see chart for details).     New left sided weakness and facial droop with normal CT. Suspect acute CVA, outside of window for intervention, will admit to hospitalist.   Final Clinical Impressions(s) / ED Diagnoses   Final diagnoses:  Acute CVA (cerebrovascular accident) [facility])  Acute CVA  (cerebrovascular accident) ([place])  Acute CVA (cerebrovascular accident) [facility])    New Prescriptions Current Discharge Medication List       Merrily Pew, MD 06/05/16 2210    Merrily Pew, MD 06/05/16 2211

## 2016-06-05 NOTE — H&P (Signed)
History and Physical    Kimberly Woods ERD:[id number] DOB: 04-14-1940 DOA: 06/05/2016  PCP: Wende Neighbors, MD  Patient coming from: Home.    Chief Complaint:  Weakness on left side and slurred speech, ictus greater than 4 hours.   HPI: Kimberly Woods is a righthanded 76 y.o. female with hx of HTN, borderline HLD, not on statin as her HDL was elevated, allergy, presented to the ER as she woke up this am with ataxia, leaning to the left, and dysarthria.  She proceeded to do her laundry, but knew something was wrong.  She went to her PCP, and noted her left leg was very weak, and she needed to lift it up with her hand to get into the car.  She has paresthesia, but had neuropathy, so she was n't sure if it is old or new.  In the ER, her symptoms improved significantly.  CT of her head showed no acute process, and serology was unremarkable.  Her EKG showed NSR.  Hospitalist was asked to admit her for TIA/CVA work up. Note that her ABCD2 score was a 3.   ED Course:  See above.  Rewiew of Systems:  Constitutional: Negative for malaise, fever and chills. No significant weight loss or weight gain Eyes: Negative for eye pain, redness and discharge, diplopia, visual changes, or flashes of light. ENMT: Negative for ear pain, hoarseness, nasal congestion, sinus pressure and sore throat. No headaches; tinnitus, drooling, or problem swallowing. Cardiovascular: Negative for chest pain, palpitations, diaphoresis, dyspnea and peripheral edema. ; No orthopnea, PND Respiratory: Negative for cough, hemoptysis, wheezing and stridor. No pleuritic chestpain. Gastrointestinal: Negative for diarrhea, constipation,  melena, blood in stool, hematemesis, jaundice and rectal bleeding.    Genitourinary: Negative for frequency, dysuria, incontinence,flank pain and hematuria; Musculoskeletal: Negative for back pain and neck pain. Negative for swelling and trauma.;  Skin: . Negative for pruritus, rash, abrasions, bruising  and skin lesion.; ulcerations Neuro: Negative for headache, lightheadedness and neck stiffness. Negative for altered level of consciousness , altered mental status, extremity weakness, burning feet, involuntary movement, seizure and syncope.  Psych: negative for anxiety, depression, insomnia, tearfulness, panic attacks, hallucinations, paranoia, suicidal or homicidal ideation    Past Medical History:  Diagnosis Date  . Allergy   . Blood transfusion without reported diagnosis    after childbirth  . Hypertension   . Osteoporosis     Past Surgical History:  Procedure Laterality Date  . APPENDECTOMY  1963  . TONSILLECTOMY  1949  . TUBAL LIGATION  1974  . VAGINAL HYSTERECTOMY       reports that she has never smoked. She has never used smokeless tobacco. She reports that she does not drink alcohol or use drugs.  No Known Allergies  Family History  Problem Relation Age of Onset  . Colon cancer Neg Hx      Prior to Admission medications   Medication Sig Start Date End Date Taking? Authorizing Provider  aspirin 81 MG tablet Take 81 mg by mouth. Takes 1 tablet every other day    Historical Provider, MD  Calcium Carbonate-Vitamin D (CALCIUM + D PO) Take 2 tablets by mouth 2 (two) times daily.    Historical Provider, MD  Cholecalciferol (VITAMIN D PO) Take by mouth daily.    Historical Provider, MD  hydrochlorothiazide (MICROZIDE) 12.5 MG capsule Take 12.5 mg by mouth daily.    Historical Provider, MD  metoprolol tartrate (LOPRESSOR) 25 MG tablet  03/19/13   Historical Provider, MD  Multiple  Vitamin (MULTIVITAMIN) tablet Take 1 tablet by mouth daily.    Historical Provider, MD  Omega 3-6-9 Fatty Acids (OMEGA-3-6-9 PO) Take by mouth 2 (two) times daily.    Historical Provider, MD  vitamin C (ASCORBIC ACID) 500 MG tablet Take 500 mg by mouth 2 (two) times daily. Takes 2 tablets twice daily    Historical Provider, MD  vitamin E 400 UNIT capsule Take 400 Units by mouth daily.    Historical  Provider, MD    Physical Exam: Vitals:   06/05/16 1801 06/05/16 1830 06/05/16 1900 06/05/16 1930  BP:  (!) 173/98 (!) 187/94 (!) 182/99  Pulse:  89 (!) 59 (!) 55  Resp:  20 11 12   Temp: 97.7 F (36.5 C)     TempSrc:      SpO2:  100% 100% 96%  Weight:      Height:        Constitutional: NAD, calm, comfortable Vitals:   06/05/16 1801 06/05/16 1830 06/05/16 1900 06/05/16 1930  BP:  (!) 173/98 (!) 187/94 (!) 182/99  Pulse:  89 (!) 59 (!) 55  Resp:  20 11 12   Temp: 97.7 F (36.5 C)     TempSrc:      SpO2:  100% 100% 96%  Weight:      Height:       Eyes: PERRL, lids and conjunctivae normal ENMT: Mucous membranes are moist. Posterior pharynx clear of any exudate or lesions.Normal dentition.  Neck: normal, supple, no masses, no thyromegaly Respiratory: clear to auscultation bilaterally, no wheezing, no crackles. Normal respiratory effort. No accessory muscle use.  Cardiovascular: Regular rate and rhythm, no murmurs / rubs / gallops. No extremity edema. 2+ pedal pulses. No carotid bruits.  Abdomen: no tenderness, no masses palpated. No hepatosplenomegaly. Bowel sounds positive.  Musculoskeletal: no clubbing / cyanosis. No joint deformity upper and lower extremities. Good ROM, no contractures. Normal muscle tone.  Skin: no rashes, lesions, ulcers. No induration Neurologic: CN 2-12 grossly intact. Sensation intact, DTR normal. Strength 5/5 in all 4.  Psychiatric: Normal judgment and insight. Alert and oriented x 3. Normal mood.   Labs on Admission: I have personally reviewed following labs and imaging studies CBC:  Recent Labs Lab 06/05/16 1736  WBC 9.0  NEUTROABS 4.8  HGB 13.7  HCT 41.6  MCV 91.0  PLT 378*   Basic Metabolic Panel:  Recent Labs Lab 06/05/16 1736  NA 139  K 3.8  CL 101  CO2 30  GLUCOSE 102*  BUN 15  CREATININE 0.60  CALCIUM 9.6   GFR: Estimated Creatinine Clearance: 48.8 mL/min (by C-G formula based on SCr of 0.6 mg/dL). Liver Function  Tests:  Recent Labs Lab 06/05/16 1736  AST 24  ALT 20  ALKPHOS 56  BILITOT 0.7  PROT 6.8  ALBUMIN 4.3   No results for input(s): LIPASE, AMYLASE in the last 168 hours. No results for input(s): AMMONIA in the last 168 hours. Coagulation Profile:  Recent Labs Lab 06/05/16 1736  INR 0.91   CBG:  Recent Labs Lab 06/05/16 1717  GLUCAP 96   Radiological Exams on Admission: Ct Head Wo Contrast  Result Date: 06/05/2016 CLINICAL DATA:  Acute onset of dizziness. Began leaning to the left this morning. Initial encounter. EXAM: CT HEAD WITHOUT CONTRAST TECHNIQUE: Contiguous axial images were obtained from the base of the skull through the vertex without intravenous contrast. COMPARISON:  None. FINDINGS: Brain: No evidence of acute infarction, hemorrhage, hydrocephalus, extra-axial collection or mass lesion/mass effect. Mild periventricular  white matter change likely reflects small vessel ischemic microangiopathy. Cerebellar atrophy is noted. The posterior fossa, including the cerebellum, brainstem and fourth ventricle, is within normal limits. The third and lateral ventricles, and basal ganglia are unremarkable in appearance. The cerebral hemispheres are symmetric in appearance, with normal gray-white differentiation. No mass effect or midline shift is seen. Vascular: No hyperdense vessel or unexpected calcification. Skull: There is no evidence of fracture; visualized osseous structures are unremarkable in appearance. Sinuses/Orbits: The visualized portions of the orbits are within normal limits. The paranasal sinuses and mastoid air cells are well-aerated. Other: No significant soft tissue abnormalities are seen. IMPRESSION: 1. No acute intracranial pathology seen on CT. 2. Mild small vessel ischemic microangiopathy. Electronically Signed   By: Garald Balding M.D.   On: 06/05/2016 18:37    EKG: Independently reviewed.   Assessment/Plan Principal Problem:   CVA (cerebral vascular accident)  [facility] [place]) Active Problems:   Hypertension    PLAN:   TIA/CVA: She likely has a hemispheric TIA/CVA, with ictus clearly out of the TPA window, and her symptoms were mild, and improving.  Given her compliance with ASA, will give her DUAT with Plavix and ASA.  Obtain ECHO, MRI, MRA, and carotid sonogram.  Will order PT/OT/ST, and admit her OBS for further work up.   Will allow persmissive HTN, but will continue with her BB.  She is stable.  Will add a statin to her regimen.     DVT prophylaxis: Lovenox.  Code Status: FULL CODE.  Family Communication: none at bedside.  Disposition Plan: To home when appropriate Consults called: None.  Admission status: OBS>    Kyzer Blowe MD FACP. [facility]  If 7PM-7AM, please contact night-coverage [email or web address] Password [email or web address]  06/05/2016, 8:10 PM

## 2016-06-06 ENCOUNTER — Observation Stay (HOSPITAL_COMMUNITY): Payer: Medicare Other

## 2016-06-06 ENCOUNTER — Observation Stay (HOSPITAL_BASED_OUTPATIENT_CLINIC_OR_DEPARTMENT_OTHER): Payer: Medicare Other

## 2016-06-06 DIAGNOSIS — I6789 Other cerebrovascular disease: Secondary | ICD-10-CM | POA: Diagnosis not present

## 2016-06-06 DIAGNOSIS — M810 Age-related osteoporosis without current pathological fracture: Secondary | ICD-10-CM | POA: Diagnosis present

## 2016-06-06 DIAGNOSIS — Z7982 Long term (current) use of aspirin: Secondary | ICD-10-CM | POA: Diagnosis not present

## 2016-06-06 DIAGNOSIS — R531 Weakness: Secondary | ICD-10-CM | POA: Diagnosis present

## 2016-06-06 DIAGNOSIS — R29810 Facial weakness: Secondary | ICD-10-CM | POA: Diagnosis present

## 2016-06-06 DIAGNOSIS — I639 Cerebral infarction, unspecified: Secondary | ICD-10-CM | POA: Diagnosis present

## 2016-06-06 DIAGNOSIS — G8194 Hemiplegia, unspecified affecting left nondominant side: Secondary | ICD-10-CM | POA: Diagnosis present

## 2016-06-06 DIAGNOSIS — G629 Polyneuropathy, unspecified: Secondary | ICD-10-CM | POA: Diagnosis present

## 2016-06-06 DIAGNOSIS — E785 Hyperlipidemia, unspecified: Secondary | ICD-10-CM | POA: Diagnosis present

## 2016-06-06 DIAGNOSIS — I10 Essential (primary) hypertension: Secondary | ICD-10-CM | POA: Diagnosis present

## 2016-06-06 LAB — LIPID PANEL
CHOL/HDL RATIO: 3.8 ratio
CHOLESTEROL: 238 mg/dL — AB (ref 0–200)
HDL: 63 mg/dL (ref >40)
LDL Cholesterol: 163 mg/dL — ABNORMAL HIGH (ref 0–99)
TRIGLYCERIDES: 61 mg/dL (ref <150)
VLDL: 12 mg/dL (ref 0–40)

## 2016-06-06 LAB — ECHOCARDIOGRAM COMPLETE
Height: 62 in
Weight: 2091.2 oz

## 2016-06-06 MED ORDER — HYDRALAZINE HCL 20 MG/ML IJ SOLN: 10.0000 mg | Freq: Four times a day (QID) | INTRAMUSCULAR | Status: DC | PRN

## 2016-06-06 NOTE — Plan of Care (Signed)
Problem: Education: Goal: Knowledge of disease or condition will improve Outcome: Progressing Pt educated on s/s stroke, what to look for and when to call 911. Stroke documentation printed out for pt and husband. Educated on high BP and how we are treating it. Educated on frequent vital signs and neuro checks. Pt adv of plan tomorrow; MRI, carotid US.

## 2016-06-06 NOTE — Consult Note (Signed)
[place] A. Merlene Laughter, MD     [email or web address]          Kimberly Woods is an 76 y.o. female.   ASSESSMENT/PLAN: 1. Acute lacunar infarct involving the posterior limb of internal capsule on the right side. Risk factors age and hypertension. She also has significant dyslipidemia. The patient should be on that higher dose aspirin. I recommend 325. The patient's Plavix which has been started during this hospitalization should be discontinued on discharge. Continue with blood pressure control. Statin is also recommended.  2. Dyslipidemia mixed.  3. Small fiber neuropathy.    The patient is a 76 year old right-handed white female who presents with acute onset of gait ataxia, left sided weakness, numbness and dysarthria. She woke up this morning with these symptoms. She presented outside the 4 1/2 hour window and therefore is not a candidate for thrombolysis. She reports that she has improved since being hospitalized. She reports that she is having tingling of the feet bilaterally. She tells me she has a history of neuropathy this is a ongoing problem. Does not report having any other symptoms. The review systems is otherwise negative.   GENERAL: Pleasant thin female in no acute distress.  HEENT: Normal  ABDOMEN: soft  EXTREMITIES: No edema   BACK: Normal  SKIN: Normal by inspection.    MENTAL STATUS: Alert and oriented. Speech, language and cognition are generally intact. Judgment and insight normal.   CRANIAL NERVES: Pupils are equal, round and reactive to light and accomodation; extra ocular movements are full, there is no significant nystagmus; visual fields are full; upper and lower facial muscles are normal in strength and symmetric, there is flattening of the L nasolabial fold; tongue is midline; uvula is midline; shoulder elevation is normal.  MOTOR: Normal tone, bulk and strength in the arms; no pronator drift. No drift of the upper extremity is. There  is mild drift left leg. Right lower extremity shows normal strength. Left lower extremity hip flexion is weak graded as 4+/5. Dorsiflexion 5.  COORDINATION: Left finger to nose is normal, right finger to nose is normal, No rest tremor; no intention tremor; no postural tremor; no bradykinesia.  REFLEXES: Deep tendon reflexes are symmetrical and normal. Babinski reflexes are flexor bilaterally.   SENSATION: Normal to light touch, temperature, and pinprick. She does not extinguish to tactile or visual double simultaneous stimulation.    NIH stroke scale 2.    Blood pressure (!) 156/93, pulse 64, temperature 97.3 F (36.3 C), temperature source Oral, resp. rate 18, height '5\' 2"'  (1.575 m), weight 130 lb 11.2 oz (59.3 kg), SpO2 100 %.  Past Medical History:  Diagnosis Date  . Allergy   . Blood transfusion without reported diagnosis    after childbirth  . Hypertension   . Osteoporosis     Past Surgical History:  Procedure Laterality Date  . APPENDECTOMY  1963  . TONSILLECTOMY  1949  . TUBAL LIGATION  1974  . VAGINAL HYSTERECTOMY      Family History  Problem Relation Age of Onset  . Colon cancer Neg Hx     Social History:  reports that she has never smoked. She has never used smokeless tobacco. She reports that she does not drink alcohol or use drugs.  Allergies: No Known Allergies  Medications: Prior to Admission medications   Medication Sig Start Date End Date Taking? Authorizing Provider  aspirin 81 MG tablet Take 81 mg by mouth daily.    Yes Historical Provider, MD  cholecalciferol (VITAMIN D) 1000 units tablet Take 1,000 Units by mouth daily.   Yes Historical Provider, MD  Cyanocobalamin (B-12 PO) Take 1 tablet by mouth daily.   Yes Historical Provider, MD  losartan-hydrochlorothiazide (HYZAAR) 50-12.5 MG tablet Take 1 tablet by mouth daily.   Yes Historical Provider, MD  Multiple Minerals-Vitamins (GNP CAL MAG ZINC +D3) TABS Take 2 tablets by mouth 2 (two) times daily.    Yes Historical Provider, MD  Multiple Vitamin (MULTIVITAMIN WITH MINERALS) TABS tablet Take 1 tablet by mouth daily.   Yes Historical Provider, MD  Omega 3-6-9 Fatty Acids (OMEGA-3-6-9 PO) Take by mouth 2 (two) times daily.   Yes Historical Provider, MD  TURMERIC PO Take 1 capsule by mouth daily.   Yes Historical Provider, MD  vitamin C (ASCORBIC ACID) 500 MG tablet Take 500 mg by mouth 2 (two) times daily.    Yes Historical Provider, MD    Scheduled Meds: . aspirin  300 mg Rectal Daily   Or  . aspirin  325 mg Oral Daily  . atorvastatin  40 mg Oral q1800  . cholecalciferol  400 Units Oral Daily  . clopidogrel  75 mg Oral Daily  . enoxaparin (LOVENOX) injection  40 mg Subcutaneous Q24H  . omega-3 acid ethyl esters  1 g Oral BID  . vitamin C  1,000 mg Oral BID   Continuous Infusions: PRN Meds:.acetaminophen **OR** acetaminophen (TYLENOL) oral liquid 160 mg/5 mL **OR** acetaminophen, hydrALAZINE, senna-docusate     Results for orders placed or performed during the hospital encounter of 06/05/16 (from the past 48 hour(s))  CBG monitoring, ED     Status: None   Collection Time: 06/05/16  5:17 PM  Result Value Ref Range   Glucose-Capillary 96 65 - 99 mg/dL  Ethanol     Status: None   Collection Time: 06/05/16  5:36 PM  Result Value Ref Range   Alcohol, Ethyl (B) <5 <5 mg/dL    Comment:        LOWEST DETECTABLE LIMIT FOR SERUM ALCOHOL IS 5 mg/dL FOR MEDICAL PURPOSES ONLY   Protime-INR     Status: None   Collection Time: 06/05/16  5:36 PM  Result Value Ref Range   Prothrombin Time 12.3 11.4 - 15.2 seconds   INR 0.91   APTT     Status: None   Collection Time: 06/05/16  5:36 PM  Result Value Ref Range   aPTT 24 24 - 36 seconds  CBC     Status: Abnormal   Collection Time: 06/05/16  5:36 PM  Result Value Ref Range   WBC 9.0 4.0 - 10.5 K/uL    Comment: WHITE COUNT CONFIRMED ON SMEAR ATYPICAL LYMPHOCYTES    RBC 4.57 3.87 - 5.11 MIL/uL    Comment: RARE NRBCs   Hemoglobin  13.7 12.0 - 15.0 g/dL   HCT 41.6 36.0 - 46.0 %   MCV 91.0 78.0 - 100.0 fL   MCH 30.0 26.0 - 34.0 pg   MCHC 32.9 30.0 - 36.0 g/dL   RDW 12.6 11.5 - 15.5 %   Platelets 139 (L) 150 - 400 K/uL    Comment: SPECIMEN CHECKED FOR CLOTS PLATELET COUNT CONFIRMED BY SMEAR   Differential     Status: None   Collection Time: 06/05/16  5:36 PM  Result Value Ref Range   Neutrophils Relative % 54 %   Neutro Abs 4.8 1.7 - 7.7 K/uL   Lymphocytes Relative 33 %   Lymphs Abs 3.0 0.7 - 4.0 K/uL  Monocytes Relative 9 %   Monocytes Absolute 0.8 0.1 - 1.0 K/uL   Eosinophils Relative 4 %   Eosinophils Absolute 0.4 0.0 - 0.7 K/uL   Basophils Relative 0 %   Basophils Absolute 0.0 0.0 - 0.1 K/uL  Comprehensive metabolic panel     Status: Abnormal   Collection Time: 06/05/16  5:36 PM  Result Value Ref Range   Sodium 139 135 - 145 mmol/L   Potassium 3.8 3.5 - 5.1 mmol/L   Chloride 101 101 - 111 mmol/L   CO2 30 22 - 32 mmol/L   Glucose, Bld 102 (H) 65 - 99 mg/dL   BUN 15 6 - 20 mg/dL   Creatinine, Ser 0.60 0.44 - 1.00 mg/dL   Calcium 9.6 8.9 - 10.3 mg/dL   Total Protein 6.8 6.5 - 8.1 g/dL   Albumin 4.3 3.5 - 5.0 g/dL   AST 24 15 - 41 U/L   ALT 20 14 - 54 U/L   Alkaline Phosphatase 56 38 - 126 U/L   Total Bilirubin 0.7 0.3 - 1.2 mg/dL   GFR calc non Af Amer >60 >60 mL/min   GFR calc Af Amer >60 >60 mL/min    Comment: (NOTE) The eGFR has been calculated using the CKD EPI equation. This calculation has not been validated in all clinical situations. eGFR's persistently <60 mL/min signify possible Chronic Kidney Disease.    Anion gap 8 5 - 15  Urinalysis, Routine w reflex microscopic     Status: Abnormal   Collection Time: 06/05/16  8:45 PM  Result Value Ref Range   Color, Urine STRAW (A) YELLOW   APPearance CLEAR CLEAR   Specific Gravity, Urine 1.004 (L) 1.005 - 1.030   pH 8.0 5.0 - 8.0   Glucose, UA NEGATIVE NEGATIVE mg/dL   Hgb urine dipstick NEGATIVE NEGATIVE   Bilirubin Urine NEGATIVE  NEGATIVE   Ketones, ur NEGATIVE NEGATIVE mg/dL   Protein, ur NEGATIVE NEGATIVE mg/dL   Nitrite NEGATIVE NEGATIVE   Leukocytes, UA TRACE (A) NEGATIVE   RBC / HPF 0-5 0 - 5 RBC/hpf   WBC, UA 0-5 0 - 5 WBC/hpf   Bacteria, UA NONE SEEN NONE SEEN   Squamous Epithelial / LPF NONE SEEN NONE SEEN  Urine rapid drug screen (hosp performed)not at [facility]     Status: None   Collection Time: 06/05/16  8:50 PM  Result Value Ref Range   Opiates NONE DETECTED NONE DETECTED   Cocaine NONE DETECTED NONE DETECTED   Benzodiazepines NONE DETECTED NONE DETECTED   Amphetamines NONE DETECTED NONE DETECTED   Tetrahydrocannabinol NONE DETECTED NONE DETECTED   Barbiturates NONE DETECTED NONE DETECTED    Comment:        DRUG SCREEN FOR MEDICAL PURPOSES ONLY.  IF CONFIRMATION IS NEEDED FOR ANY PURPOSE, NOTIFY LAB WITHIN 5 DAYS.        LOWEST DETECTABLE LIMITS FOR URINE DRUG SCREEN Drug Class       Cutoff (ng/mL) Amphetamine      1000 Barbiturate      200 Benzodiazepine   725 Tricyclics       366 Opiates          300 Cocaine          300 THC              50   Lipid panel     Status: Abnormal   Collection Time: 06/06/16  5:49 AM  Result Value Ref Range   Cholesterol 238 (H) 0 -  200 mg/dL   Triglycerides 61 <150 mg/dL   HDL 63 >40 mg/dL   Total CHOL/HDL Ratio 3.8 RATIO   VLDL 12 0 - 40 mg/dL   LDL Cholesterol 163 (H) 0 - 99 mg/dL    Comment:        Total Cholesterol/HDL:CHD Risk Coronary Heart Disease Risk Table                     Men   Women  1/2 Average Risk   3.4   3.3  Average Risk       5.0   4.4  2 X Average Risk   9.6   7.1  3 X Average Risk  23.4   11.0        Use the calculated Patient Ratio above and the CHD Risk Table to determine the patient's CHD Risk.        ATP III CLASSIFICATION (LDL):  <100     mg/dL   Optimal  100-129  mg/dL   Near or Above                    Optimal  130-159  mg/dL   Borderline  160-189  mg/dL   High  >190     mg/dL   Very High      Studies/Results:  CAROTID DOPPLERS IMPRESSION: Minimal atherosclerotic disease in the carotid arteries. No significant stenosis. Estimated degree of stenosis in the internal carotid arteries is less than 50% bilaterally.   Patent vertebral arteries with antegrade flow.    BRAIN MRA FINDINGS: Antegrade flow in the posterior circulation. Mild distal vertebral artery irregularity without stenosis. Fairly codominant distal vertebral arteries. Normal right PICA origin. Patent vertebrobasilar junction with mild tortuosity. No basilar stenosis. AICA and PCA origins are normal. Posterior communicating arteries are diminutive or absent. Left PCA branches are within normal limits. There is very mild right PCA distal P1 irregularity. No right PCA stenosis.   Antegrade flow in both ICA siphons. Irregularity and stenosis at the right anterior genu is probably exaggerated by paranasal sinus susceptibility artifact. No left ICA siphon stenosis suspected. Ophthalmic artery origins are within normal limits. Normal carotid termini. Normal MCA and ACA origins. Diminutive anterior communicating artery. Visible ACA branches are within normal limits. Visible bilateral MCAs and MCA branches are within normal limits.   IMPRESSION: Mild for age intracranial atherosclerosis. No large vessel or circle of Willis branch occlusion, and no significant intracranial stenosis suspected.       BRAIN MRI FINDINGS: Brain: Oval 9 mm focus of restricted diffusion at the posterior limb of the right internal capsule (series 5, image 72). Subtle T2 hyperintensity. No associated hemorrhage or mass effect.   No other restricted diffusion. No midline shift, mass effect, evidence of mass lesion, ventriculomegaly, extra-axial collection or acute intracranial hemorrhage. Cervicomedullary junction and pituitary are within normal limits.   Small chronic lacunar infarct in the right inferior cerebellum (series  9, image 5). Mild for age mostly periventricular white matter T2 and FLAIR hyperintensity. Negative brainstem. Deep gray matter nuclei are within normal limits. No cortical encephalomalacia or chronic cerebral blood products.   Vascular: Major intracranial vascular flow voids are preserved.   Skull and upper cervical spine: Negative.   Sinuses/Orbits: Negative.   Other: Mild right inferior mastoid effusion. Negative nasopharynx. Visible internal auditory structures appear normal. Left mastoids are clear. Negative scalp soft tissues.   IMPRESSION: 1. Acute lacunar infarct in the posterior limb of the right  internal capsule. No associated hemorrhage or mass effect. 2. Otherwise minimal to mild for age chronic small vessel disease. 3. Trace right mastoid fluid, likely postinfectious and inconsequential.      ECHO  - Mild LVH with LVEF 55-60% and grade 2 diastolic dysfunction.   Trivial mitral regurgitation. Mildly calcified aortic annulus.   Trivial tricuspid regurgitation with PASP 25 mmHg. No obvious PFO   or ASD.    The brain MRI scan is reviewed in person. There is increased signal seen in internal capsule posterior limb on the right side. There may be some extension to the basal ganglia. This is a lacunar event. There is corresponding reduced signal seen on the ADC scan. 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 No hemorrhage is appreciated.    Shandell Jallow A. Merlene Laughter, M.D.  Diplomate, Tax adviser of Psychiatry and Neurology ( Neurology). 06/06/2016, 7:47 PM

## 2016-06-06 NOTE — Care Management Obs Status (Signed)
[facility] NOTIFICATION   Patient Details  Name: Kimberly Woods MRN: [id number] Date of Birth: [birth year]   Medicare Observation Status Notification Given:  Yes    Sonal Dorwart, Chauncey Reading, RN 06/06/2016, 12:53 PM

## 2016-06-06 NOTE — Progress Notes (Signed)
PROGRESS NOTE    ZARI CLY  WER:[id number] DOB: [birth year] DOA: 06/05/2016 PCP: Wende Neighbors, MD  Brief Narrative: Kimberly Woods is a righthanded 76 y.o. female with hx of HTN, borderline HLD, not on statin as her HDL was elevated, allergy, presented to the ER as she woke up this am with ataxia, leaning to the left, and dysarthria.  She proceeded to do her laundry, but knew something was wrong.  She went to her PCP, and noted her left leg was very weak, and she needed to lift it up with her hand to get into the car.  She has paresthesia, but had neuropathy, so she was n't sure if it is old or new.  In the ER, her symptoms improved significantly.  CT of her head showed no acute process, and serology was unremarkable.  Her EKG showed NSR.  Hospitalist was asked to admit her for TIA/CVA work up. Note that her ABCD2 score was a 3.    Assessment & Plan:   Principal Problem:   CVA (cerebral vascular accident) [facility]) Active Problems:   Hypertension   1-Acute stroke;  Presents with left facial weakness, and slurred speech.  MRI; Acute lacunar infarct in the posterior limb of the right internal capsule. No associated hemorrhage or mass effect LDL 163.  Started on stating and Plavix.  Await ECHO.  Permissive HTN in setting acute stroke.  [place]; Minimal atherosclerotic disease in the carotid arteries. No significant stenosis. Estimated degree of stenosis in the internal carotid arteries is less than 50% bilaterally. HbA-1c pending.   2-HTN; Permissive HTN in setting of acute stroke.  Will resume home meds in 24 hours.  PRN hydralazine.       DVT prophylaxis: lovenox Code Status: full code.  Family Communication: husband who was at bedside.  Disposition Plan: home when work up completed,   Consultants:   Neurology    Procedures:  [place]; Minimal atherosclerotic disease in the carotid arteries. No significant stenosis. Estimated degree of stenosis in the internal carotid  arteries is less than 50% bilaterally.  Patent vertebral arteries with antegrade flow.     ECHO pending.    Antimicrobials:  none   Subjective: Feeling better, weakness left arm almost resolved, speech improved.   Objective: Vitals:   06/06/16 0350 06/06/16 0550 06/06/16 0750 06/06/16 1100  BP: (!) 157/95 (!) 177/88 (!) 148/98 (!) 168/93  Pulse: (!) 49 (!) 50 (!) 52 (!) 52  Resp: 16  15 16   Temp: 99.1 F (37.3 C) 97.8 F (36.6 C) 99.1 F (37.3 C) 97.6 F (36.4 C)  TempSrc: Oral Oral Oral Oral  SpO2: 100% 99% 97% 100%  Weight:      Height:        Intake/Output Summary (Last 24 hours) at 06/06/16 1256 Last data filed at 06/06/16 1047  Gross per 24 hour  Intake              730 ml  Output             1700 ml  Net             -970 ml   Filed Weights   06/05/16 1722 06/05/16 2150  Weight: 59.4 kg (131 lb) 59.3 kg (130 lb 11.2 oz)    Examination:  General exam: Appears calm and comfortable  Respiratory system: Clear to auscultation. Respiratory effort normal. Cardiovascular system: S1 & S2 heard, RRR. No JVD, murmurs, rubs, gallops or clicks. No pedal edema. Gastrointestinal system:  Abdomen is nondistended, soft and nontender. No organomegaly or masses felt. Normal bowel sounds heard. Central nervous system: Alert and oriented. Left facial droop, left Upper extremity mild drift, 4-5/5 Extremities no edema Skin: No rashes, lesions or ulcers Psychiatry: Judgement and insight appear normal. Mood & affect appropriate.     Data Reviewed: I have personally reviewed following labs and imaging studies  CBC:  Recent Labs Lab 06/05/16 1736  WBC 9.0  NEUTROABS 4.8  HGB 13.7  HCT 41.6  MCV 91.0  PLT 161*   Basic Metabolic Panel:  Recent Labs Lab 06/05/16 1736  NA 139  K 3.8  CL 101  CO2 30  GLUCOSE 102*  BUN 15  CREATININE 0.60  CALCIUM 9.6   GFR: Estimated Creatinine Clearance: 48.8 mL/min (by C-G formula based on SCr of 0.6 mg/dL). Liver  Function Tests:  Recent Labs Lab 06/05/16 1736  AST 24  ALT 20  ALKPHOS 56  BILITOT 0.7  PROT 6.8  ALBUMIN 4.3   No results for input(s): LIPASE, AMYLASE in the last 168 hours. No results for input(s): AMMONIA in the last 168 hours. Coagulation Profile:  Recent Labs Lab 06/05/16 1736  INR 0.91   Cardiac Enzymes: No results for input(s): CKTOTAL, CKMB, CKMBINDEX, TROPONINI in the last 168 hours. BNP (last 3 results) No results for input(s): PROBNP in the last 8760 hours. HbA1C: No results for input(s): HGBA1C in the last 72 hours. CBG:  Recent Labs Lab 06/05/16 1717  GLUCAP 96   Lipid Profile:  Recent Labs  06/06/16 0549  CHOL 238*  HDL 63  LDLCALC 163*  TRIG 61  CHOLHDL 3.8   Thyroid Function Tests: No results for input(s): TSH, T4TOTAL, FREET4, T3FREE, THYROIDAB in the last 72 hours. Anemia Panel: No results for input(s): VITAMINB12, FOLATE, FERRITIN, TIBC, IRON, RETICCTPCT in the last 72 hours. Sepsis Labs: No results for input(s): PROCALCITON, LATICACIDVEN in the last 168 hours.  No results found for this or any previous visit (from the past 240 hour(s)).       Radiology Studies: Ct Head Wo Contrast  Result Date: 06/05/2016 CLINICAL DATA:  Acute onset of dizziness. Began leaning to the left this morning. Initial encounter. EXAM: CT HEAD WITHOUT CONTRAST TECHNIQUE: Contiguous axial images were obtained from the base of the skull through the vertex without intravenous contrast. COMPARISON:  None. FINDINGS: Brain: No evidence of acute infarction, hemorrhage, hydrocephalus, extra-axial collection or mass lesion/mass effect. Mild periventricular white matter change likely reflects small vessel ischemic microangiopathy. Cerebellar atrophy is noted. The posterior fossa, including the cerebellum, brainstem and fourth ventricle, is within normal limits. The third and lateral ventricles, and basal ganglia are unremarkable in appearance. The cerebral hemispheres  are symmetric in appearance, with normal gray-white differentiation. No mass effect or midline shift is seen. Vascular: No hyperdense vessel or unexpected calcification. Skull: There is no evidence of fracture; visualized osseous structures are unremarkable in appearance. Sinuses/Orbits: The visualized portions of the orbits are within normal limits. The paranasal sinuses and mastoid air cells are well-aerated. Other: No significant soft tissue abnormalities are seen. IMPRESSION: 1. No acute intracranial pathology seen on CT. 2. Mild small vessel ischemic microangiopathy. Electronically Signed   By: Garald Balding M.D.   On: 06/05/2016 18:37   [name] Contrast  Result Date: 06/06/2016 CLINICAL DATA:  76 year old female with left side weakness and slurred speech for 1 day. Acute onset dizziness. EXAM: MRI HEAD WITHOUT CONTRAST TECHNIQUE: Multiplanar, multiecho pulse sequences of the brain and surrounding  structures were obtained without intravenous contrast. COMPARISON:  Head CT without contrast 06/05/2016. FINDINGS: Brain: Oval 9 mm focus of restricted diffusion at the posterior limb of the right internal capsule (series 5, image 72). Subtle T2 hyperintensity. No associated hemorrhage or mass effect. No other restricted diffusion. No midline shift, mass effect, evidence of mass lesion, ventriculomegaly, extra-axial collection or acute intracranial hemorrhage. Cervicomedullary junction and pituitary are within normal limits. Small chronic lacunar infarct in the right inferior cerebellum (series 9, image 5). Mild for age mostly periventricular white matter T2 and FLAIR hyperintensity. Negative brainstem. Deep gray matter nuclei are within normal limits. No cortical encephalomalacia or chronic cerebral blood products. Vascular: Major intracranial vascular flow voids are preserved. Skull and upper cervical spine: Negative. Sinuses/Orbits: Negative. Other: Mild right inferior mastoid effusion. Negative  nasopharynx. Visible internal auditory structures appear normal. Left mastoids are clear. Negative scalp soft tissues. IMPRESSION: 1. Acute lacunar infarct in the posterior limb of the right internal capsule. No associated hemorrhage or mass effect. 2. Otherwise minimal to mild for age chronic small vessel disease. 3. Trace right mastoid fluid, likely postinfectious and inconsequential. Electronically Signed   By: Genevie Ann M.D.   On: 06/06/2016 10:28   US Carotid Bilateral (at [facility] Only)  Result Date: 06/06/2016 CLINICAL DATA:  Acute CVA. Acute infarct in the posterior limb of the right internal capsule. EXAM: BILATERAL CAROTID DUPLEX ULTRASOUND TECHNIQUE: Pearline Cables scale imaging, color Doppler and duplex ultrasound were performed of bilateral carotid and vertebral arteries in the neck. COMPARISON:  Brain MRI 06/06/2016 FINDINGS: Criteria: Quantification of carotid stenosis is based on velocity parameters that correlate the residual internal carotid diameter with NASCET-based stenosis levels, using the diameter of the distal internal carotid lumen as the denominator for stenosis measurement. The following velocity measurements were obtained: RIGHT ICA:  45 cm/sec CCA:  50 cm/sec SYSTOLIC ICA/CCA RATIO:  0.9 DIASTOLIC ICA/CCA RATIO:  1.5 ECA:  45 cm/sec LEFT ICA:  41 cm/sec CCA:  56 cm/sec SYSTOLIC ICA/CCA RATIO:  0.7 DIASTOLIC ICA/CCA RATIO:  1.1 ECA:  41 cm/sec RIGHT CAROTID ARTERY: Small amount of plaque and intimal thickening in the distal common carotid artery. No significant stenosis. Minimal disease at the right carotid bulb. External carotid artery is patent with normal waveform. Normal waveforms and velocities in the internal carotid artery. RIGHT VERTEBRAL ARTERY: Antegrade flow and normal waveform in the right vertebral artery. LEFT CAROTID ARTERY: Intimal thickening in the left common carotid artery. Minimal plaque at the left carotid bulb. External carotid artery is patent with normal waveform.  Small amount of echogenic plaque in the proximal internal carotid artery. Normal waveforms and velocities in the internal carotid artery. LEFT VERTEBRAL ARTERY: Antegrade flow and normal waveform in the left vertebral artery. IMPRESSION: Minimal atherosclerotic disease in the carotid arteries. No significant stenosis. Estimated degree of stenosis in the internal carotid arteries is less than 50% bilaterally. Patent vertebral arteries with antegrade flow. Electronically Signed   By: Markus Daft M.D.   On: 06/06/2016 10:57   Mr Jodene Nam Head/brain ST Cm  Result Date: 06/06/2016 CLINICAL DATA:  76 year old female with left side weakness and slurred speech for 1 day. Acute onset dizziness. Acute lacunar infarct right internal capsule. EXAM: MRA HEAD WITHOUT CONTRAST TECHNIQUE: Angiographic images of the Circle of Willis were obtained using MRA technique without intravenous contrast. COMPARISON:  Brain MRI from today reported separately. FINDINGS: Antegrade flow in the posterior circulation. Mild distal vertebral artery irregularity without stenosis. Fairly codominant distal vertebral arteries.  Normal right PICA origin. Patent vertebrobasilar junction with mild tortuosity. No basilar stenosis. AICA and PCA origins are normal. Posterior communicating arteries are diminutive or absent. Left PCA branches are within normal limits. There is very mild right PCA distal P1 irregularity. No right PCA stenosis. Antegrade flow in both ICA siphons. Irregularity and stenosis at the right anterior genu is probably exaggerated by paranasal sinus susceptibility artifact. No left ICA siphon stenosis suspected. Ophthalmic artery origins are within normal limits. Normal carotid termini. Normal MCA and ACA origins. Diminutive anterior communicating artery. Visible ACA branches are within normal limits. Visible bilateral MCAs and MCA branches are within normal limits. IMPRESSION: Mild for age intracranial atherosclerosis. No large vessel or  circle of Willis branch occlusion, and no significant intracranial stenosis suspected. Electronically Signed   By: Genevie Ann M.D.   On: 06/06/2016 10:32        Scheduled Meds: . aspirin  300 mg Rectal Daily   Or  . aspirin  325 mg Oral Daily  . atorvastatin  40 mg Oral q1800  . cholecalciferol  400 Units Oral Daily  . clopidogrel  75 mg Oral Daily  . enoxaparin (LOVENOX) injection  40 mg Subcutaneous Q24H  . omega-3 acid ethyl esters  1 g Oral BID  . vitamin C  1,000 mg Oral BID   Continuous Infusions:   LOS: 0 days    Time spent: 35 minutes.     Elmarie Shiley, MD [facility] Pager [phone]  If 7PM-7AM, please contact night-coverage [email or web address] Password [facility] 06/06/2016, 12:56 PM

## 2016-06-06 NOTE — Evaluation (Signed)
Physical Therapy Evaluation Patient Details Name: Kimberly Woods MRN: [id number] DOB: 04/25/1940 Today's Date: 06/06/2016   History of Present Illness  Primary symptoms include focal weakness, loss of balance, speech change. This is a new problem. The current episode started 6 to 12 hours ago. The problem has been gradually improving. There was left facial, left upper extremity and left lower extremity focality noted. Pertinent negatives include no shortness of breath and no chest pain. There were no medications administered prior to arrival. Associated medical issues do not include trauma or seizures.   MRI showed Acute lacunar infarct in the posterior limb of the right internal capsule.     Clinical Impression  Pt received in bed, husband present, and pt is agreeable to PT evaluation.  Pt is normally independent with unlimited community distances, as well as independent with ADL's, and IADL's.  During PT evaluation, she states that her balance is her main concern.  She still is c/o of some tingling in B feet, which she feels is related to her neuropathy.  During PT evaluation, although she ambulated 63f, she did have 3 instances of mild/moderate LOB which required CGA/Min A from PT to prevent pt from falling.  She scored a 50/56 on the BERG balance test, which places her at moderate risk for falling.  Therefore, she is recommended to f/u with OPPT for further balance re-training.      Follow Up Recommendations Outpatient PT    Equipment Recommendations  None recommended by PT    Recommendations for Other Services       Precautions / Restrictions Precautions Precautions: None Restrictions Weight Bearing Restrictions: No      Mobility  Bed Mobility Overal bed mobility: Independent                Transfers Overall transfer level: Independent Equipment used: None                Ambulation/Gait Ambulation/Gait assistance: Supervision Ambulation Distance (Feet):  600 Feet Assistive device: None Gait Pattern/deviations: Scissoring   Gait velocity interpretation: at or above normal speed for age/gender General Gait Details: occasional scissor step due to LOB.  Pt demonstrated 2-3 min/mod LOB which required CGA/Min A to correct.  Mostly with directional changes.    Stairs            Wheelchair Mobility    Modified Rankin (Stroke Patients Only) Modified Rankin (Stroke Patients Only) Pre-Morbid Rankin Score: No symptoms Modified Rankin: No significant disability     Balance                                 Standardized Balance Assessment Standardized Balance Assessment : Berg Balance Test Berg Balance Test Sit to Stand: Able to stand without using hands and stabilize independently Standing Unsupported: Able to stand safely 2 minutes Sitting with Back Unsupported but Feet Supported on Floor or Stool: Able to sit safely and securely 2 minutes Stand to Sit: Sits safely with minimal use of hands Transfers: Able to transfer safely, minor use of hands Standing Unsupported with Eyes Closed: Able to stand 10 seconds safely Standing Ubsupported with Feet Together: Able to place feet together independently and stand 1 minute safely From Standing, Reach Forward with Outstretched Arm: Can reach forward >12 cm safely (5") From Standing Position, Pick up Object from Floor: Able to pick up shoe, needs supervision From Standing Position, Turn to Look Behind Over each Shoulder:  Looks behind from both sides and weight shifts well Turn 360 Degrees: Able to turn 360 degrees safely in 4 seconds or less Standing Unsupported, Alternately Place Feet on Step/Stool: Able to stand independently and complete 8 steps >20 seconds Standing Unsupported, One Foot in Front: Able to plae foot ahead of the other independently and hold 30 seconds Standing on One Leg: Able to lift leg independently and hold equal to or more than 3 seconds Total Score: 50          Pertinent Vitals/Pain Pain Assessment: No/denies pain    Home Living Family/patient expects to be discharged to:: Private residence Living Arrangements: Spouse/significant other   Type of Home: House Home Access: Stairs to enter Entrance Stairs-Rails: Right;Left;Can reach both Entrance Stairs-Number of Steps: 3 Home Layout: Two level;Able to live on main level with bedroom/bathroom Home Equipment: Cane - single point;Grab bars - tub/shower;Walker - standard;Shower seat      Prior Function Level of Independence: Independent         Comments: Patient drives     Hand Dominance   Dominant Hand: Right    Extremity/Trunk Assessment   Upper Extremity Assessment Upper Extremity Assessment: Defer to OT evaluation    Lower Extremity Assessment Lower Extremity Assessment: Overall WFL for tasks assessed    Cervical / Trunk Assessment Cervical / Trunk Assessment: Normal  Communication   Communication: No difficulties  Cognition Arousal/Alertness: Awake/alert Behavior During Therapy: WFL for tasks assessed/performed Overall Cognitive Status: Within Functional Limits for tasks assessed                                        General Comments      Exercises     Assessment/Plan    PT Assessment All further PT needs can be met in the next venue of care  PT Problem List Decreased balance;Decreased mobility;Decreased coordination;Decreased safety awareness       PT Treatment Interventions Gait training;Therapeutic activities;Balance training;Therapeutic exercise;Neuromuscular re-education;Patient/family education    PT Goals (Current goals can be found in the Care Plan section)  Acute Rehab PT Goals PT Goal Formulation: All assessment and education complete, DC therapy    Frequency     Barriers to discharge        Co-evaluation               End of Session Equipment Utilized During Treatment: Gait belt Activity Tolerance: Patient  tolerated treatment well Patient left: with family/visitor present;with call bell/phone within reach (Sitting on the EOB) Nurse Communication: Mobility status (mobility sheet left in the room.  ) PT Visit Diagnosis: Other abnormalities of gait and mobility (R26.89);Muscle weakness (generalized) (M62.81)    Time: 3568-6168 PT Time Calculation (min) (ACUTE ONLY): 23 min   Charges:   PT Evaluation $PT Eval Low Complexity: 1 Procedure PT Treatments $Gait Training: 8-22 mins   PT G Codes:   PT G-Codes **NOT FOR INPATIENT CLASS** Functional Assessment Tool Used: AM-PAC 6 Clicks Basic Mobility;Clinical judgement Functional Limitation: Mobility: Walking and moving around Mobility: Walking and Moving Around Current Status (H7290): At least 20 percent but less than 40 percent impaired, limited or restricted Mobility: Walking and Moving Around Goal Status [phone]): At least 20 percent but less than 40 percent impaired, limited or restricted Mobility: Walking and Moving Around Discharge Status [phone]): At least 20 percent but less than 40 percent impaired, limited or restricted  Beth Khadijah Mastrianni, PT, DPT X: [phone]

## 2016-06-06 NOTE — Evaluation (Signed)
Occupational Therapy Evaluation Patient Details Name: NEAL OSHEA MRN: [id number] DOB: 1940-07-24 Today's Date: 06/06/2016    History of Present Illness Primary symptoms include focal weakness, loss of balance, speech change. This is a new problem. The current episode started 6 to 12 hours ago. The problem has been gradually improving. There was left facial, left upper extremity and left lower extremity focality noted. Pertinent negatives include no shortness of breath and no chest pain. There were no medications administered prior to arrival. Associated medical issues do not include trauma or seizures.    Clinical Impression   Patient presents seated on EOB with husband present. Agreeable to participate in OT evaluation. Patient shows a very slight decrease in gross motor coordination. UB strength and ROM is WNL. Patient was educated to continue using LUE for all daily activities and complete coordination tasks independently with suggestion given. Patient does not require any follow up OT services at this time.     Follow Up Recommendations  No OT follow up    Equipment Recommendations  None recommended by OT       Precautions / Restrictions Precautions Precautions: None Restrictions Weight Bearing Restrictions: No      Mobility                   Transfers Overall transfer level: Independent Equipment used: None                      ADL either performed or assessed with clinical judgement   ADL Overall ADL's : Independent;At baseline                                                          Pertinent Vitals/Pain Pain Assessment: No/denies pain     Hand Dominance Right   Extremity/Trunk Assessment Upper Extremity Assessment Upper Extremity Assessment: Overall WFL for tasks assessed (Slight decreased gross motor coordination. )   Lower Extremity Assessment Lower Extremity Assessment: Defer to PT evaluation        Communication Communication Communication: No difficulties   Cognition Arousal/Alertness: Awake/alert Behavior During Therapy: WFL for tasks assessed/performed Overall Cognitive Status: Within Functional Limits for tasks assessed                                                Home Living Family/patient expects to be discharged to:: Private residence Living Arrangements: Spouse/significant other   Type of Home: House Home Access: Stairs to enter [employer] of Steps: 3 Entrance Stairs-Rails: Right;Left;Can reach both Home Layout: Two level;Able to live on main level with bedroom/bathroom     Bathroom Shower/Tub: Tub/shower unit   Bathroom Toilet: Handicapped height     Home Equipment: Cane - single point;Grab bars - tub/shower;Walker - standard;Shower seat          Prior Functioning/Environment Level of Independence: Independent        Comments: Patient drives                                  End of Session    Activity Tolerance: Patient tolerated treatment well Patient  left: in bed;with call bell/phone within reach;with family/visitor present  OT Visit Diagnosis: Muscle weakness (generalized) (M62.81)                Time: 5681-2751 OT Time Calculation (min): 11 min Charges:  OT General Charges $OT Visit: 1 Procedure OT Evaluation $OT Eval Low Complexity: 1 Procedure G-Codes: OT G-codes **NOT FOR INPATIENT CLASS** Functional Assessment Tool Used: AM-PAC 6 Clicks Daily Activity Functional Limitation: Self care Self Care Current Status (Z0017): 0 percent impaired, limited or restricted Self Care Goal Status (C9449): 0 percent impaired, limited or restricted Self Care Discharge Status (Q7591): 0 percent impaired, limited or restricted   Ailene Ravel, OTR/L,CBIS  [phone]  Editha Bridgeforth, Clarene Duke 06/06/2016, 9:19 AM

## 2016-06-06 NOTE — Progress Notes (Signed)
*  PRELIMINARY RESULTS* Echocardiogram 2D Echocardiogram has been performed.  Leavy Cella 06/06/2016, 11:57 AM

## 2016-06-06 NOTE — Care Management Note (Addendum)
Case Management Note  Patient Details  Name: Kimberly Woods MRN: [id number] Date of Birth: February 15, 1940  Subjective/Objective:                  Adm with CVA workup. From home with husband, ind with ADL's. No recommendations thus far, eating lunch currently without problems.   Action/Plan:Plans to return home at time of discharge. No CM needs anticipated.    ADDENDUM. PT recommends OP PT. Patient agreeable. Electronic referral sent to [facility].   Expected Discharge Date:    06/06/2016              Expected Discharge Plan:  Home/Self Care  In-House Referral:     Discharge planning Services  CM Consult  Post Acute Care Choice:    Choice offered to:  NA  DME Arranged:    DME Agency:     HH Arranged:    HH Agency:     Status of Service:  Completed, signed off  If discussed at [employer] of Stay Meetings, dates discussed:    Additional Comments:  Kimberly Woods, Kimberly Reading, RN 06/06/2016, 12:48 PM

## 2016-06-07 DIAGNOSIS — I639 Cerebral infarction, unspecified: Secondary | ICD-10-CM | POA: Diagnosis not present

## 2016-06-07 LAB — HEMOGLOBIN A1C
HEMOGLOBIN A1C: 5.6 % (ref 4.8–5.6)
MEAN PLASMA GLUCOSE: 114 mg/dL

## 2016-06-07 MED ORDER — ASPIRIN 325 MG PO TABS: 325.0000 mg | ORAL_TABLET | Freq: Every day | ORAL | 0 refills | Status: DC

## 2016-06-07 MED ORDER — ASPIRIN 325 MG PO TABS: 325.0000 mg | ORAL_TABLET | Freq: Every day | ORAL | 0 refills | Status: AC

## 2016-06-07 MED ORDER — ATORVASTATIN CALCIUM 40 MG PO TABS: 40.0000 mg | ORAL_TABLET | Freq: Every day | ORAL | 0 refills | Status: AC

## 2016-06-07 MED ORDER — LOSARTAN POTASSIUM 50 MG PO TABS: 50.0000 mg | ORAL_TABLET | Freq: Every day | ORAL | Status: DC

## 2016-06-07 MED ORDER — LOSARTAN POTASSIUM-HCTZ 50-12.5 MG PO TABS: 1.0000 | ORAL_TABLET | Freq: Every day | ORAL | Status: DC

## 2016-06-07 MED ORDER — ATORVASTATIN CALCIUM 40 MG PO TABS: 40.0000 mg | ORAL_TABLET | Freq: Every day | ORAL | 0 refills | Status: DC

## 2016-06-07 MED ORDER — HYDROCHLOROTHIAZIDE 12.5 MG PO CAPS: 12.5000 mg | ORAL_CAPSULE | Freq: Every day | ORAL | Status: DC

## 2016-06-07 NOTE — Progress Notes (Signed)
SLP Cancellation Note  Patient Details Name: Kimberly Woods MRN: [id number] DOB: August 10, 1940   Cancelled treatment:       Reason Eval/Treat Not Completed: SLP screened, no needs identified, will sign off. Speech has returned to baseline.  Thank you,  Genene Churn, [place]    [place] 06/07/2016, 8:25 AM

## 2016-06-07 NOTE — Care Management Important Message (Signed)
Important Message  Patient Details  Name: Kimberly Woods MRN: [id number] Date of Birth: 1940-10-31   Medicare Important Message Given:  Yes    Sherald Barge, RN 06/07/2016, 10:26 AM

## 2016-06-07 NOTE — Care Management Note (Signed)
Case Management Note  Patient Details  Name: [name] MRN: [id number] Date of Birth: 01-07-1941  Expected Discharge Date:  06/07/16               Expected Discharge Plan:  Home/Self Care  In-House Referral:  NA  Discharge planning Services  CM Consult  Post Acute Care Choice:  NA Choice offered to:  NA  Status of Service:  Completed, signed off  Additional Comments: Pt discharging home today with self care. OP PT referral has been made.  Sherald Barge, RN 06/07/2016, 10:26 AM

## 2016-06-07 NOTE — Discharge Summary (Signed)
Physician Discharge Summary  DIXIE COPPA RWE:[id number] DOB: 1940-07-29 DOA: 06/05/2016  PCP: Kimberly Neighbors, MD  Admit date: 06/05/2016 Discharge date: 06/07/2016  Admitted From: Home  Disposition:  Home   Recommendations for Outpatient Follow-up:  1. Follow up with PCP in 1-2 weeks 2. Please obtain BMP/CBC in one week 3. Please follow up on the following pending results:    Discharge Condition: stable.  CODE STATUS: full code.  Diet recommendation: Heart Healthy  Brief/Interim Summary: Kimberly Woods McCrackenis a righthanded 76 y.o.femalewith hx of HTN, borderline HLD, not on statin as her HDL was elevated, allergy, presented to the ER as she woke up this am with ataxia, leaning to the left, and dysarthria. She proceeded to do her laundry, but knew something was wrong. She went to her PCP, and noted her left leg was very weak, and she needed to lift it up with her hand to get into the car. She has paresthesia, but had neuropathy, so she was n't sure if it is old or new. In the ER, her symptoms improved significantly. CT of her head showed no acute process, and serology was unremarkable. Her EKG showed NSR. Hospitalist was asked to admit her for TIA/CVA work up. Note that her ABCD2 score was a 3.    Assessment & Plan:   Principal Problem:   CVA (cerebral vascular accident) [facility]) Active Problems:   Hypertension   1-Acute stroke;  Presents with left facial weakness, and slurred speech.  MRI; Acute lacunar infarct in the posterior limb of the right internal capsule. No associated hemorrhage or mass effect LDL 163.  Discharge on aspirin 325 and statins.  ECHO no PFO, diastolic dysfunction  Permissive HTN in setting acute stroke.  [place]; Minimal atherosclerotic disease in the carotid arteries. No significant stenosis. Estimated degree of stenosis in the internal carotid arteries is less than 50% bilaterally. HbA-1c 5.6  2-HTN; Permissive HTN in setting of acute stroke.   Will resume home med PRN hydralazine.   Discharge Diagnoses:  Principal Problem:   CVA (cerebral vascular accident) [facility]) Active Problems:   Hypertension    Discharge Instructions  Discharge Instructions    Ambulatory referral to Physical Therapy    Complete by:  As directed    Diet - low sodium heart healthy    Complete by:  As directed    Increase activity slowly    Complete by:  As directed      Allergies as of 06/07/2016   No Known Allergies     Medication List    STOP taking these medications   TURMERIC PO     TAKE these medications   aspirin 325 MG tablet Take 1 tablet (325 mg total) by mouth daily. What changed:  medication strength  how much to take   atorvastatin 40 MG tablet Commonly known as:  LIPITOR Take 1 tablet (40 mg total) by mouth daily at 6 PM.   B-12 PO Take 1 tablet by mouth daily.   cholecalciferol 1000 units tablet Commonly known as:  VITAMIN D Take 1,000 Units by mouth daily.   GNP CAL MAG ZINC +D3 Tabs Take 2 tablets by mouth 2 (two) times daily.   losartan-hydrochlorothiazide 50-12.5 MG tablet Commonly known as:  HYZAAR Take 1 tablet by mouth daily.   multivitamin with minerals Tabs tablet Take 1 tablet by mouth daily.   OMEGA-3-6-9 PO Take by mouth 2 (two) times daily.   vitamin C 500 MG tablet Commonly known as:  ASCORBIC ACID Take  500 mg by mouth 2 (two) times daily.      Follow-up Information    Kimberly Neighbors, MD Follow up in 3 day(s).   Specialty:  Internal Medicine Contact information: [place] [address] [phone]        [facility], Kimberly Sailors, MD Follow up in 4 week(s).   Specialty:  Neurology Contact information: [address] [place] [address] [phone]          No Known Allergies  Consultations:  Neurology.    Procedures/Studies: Ct Head Wo Contrast  Result Date: 06/05/2016 CLINICAL DATA:  Acute onset of dizziness. Began leaning to the left this  morning. Initial encounter. EXAM: CT HEAD WITHOUT CONTRAST TECHNIQUE: Contiguous axial images were obtained from the base of the skull through the vertex without intravenous contrast. COMPARISON:  None. FINDINGS: Brain: No evidence of acute infarction, hemorrhage, hydrocephalus, extra-axial collection or mass lesion/mass effect. Mild periventricular white matter change likely reflects small vessel ischemic microangiopathy. Cerebellar atrophy is noted. The posterior fossa, including the cerebellum, brainstem and fourth ventricle, is within normal limits. The third and lateral ventricles, and basal ganglia are unremarkable in appearance. The cerebral hemispheres are symmetric in appearance, with normal gray-white differentiation. No mass effect or midline shift is seen. Vascular: No hyperdense vessel or unexpected calcification. Skull: There is no evidence of fracture; visualized osseous structures are unremarkable in appearance. Sinuses/Orbits: The visualized portions of the orbits are within normal limits. The paranasal sinuses and mastoid air cells are well-aerated. Other: No significant soft tissue abnormalities are seen. IMPRESSION: 1. No acute intracranial pathology seen on CT. 2. Mild small vessel ischemic microangiopathy. Electronically Signed   By: Garald Balding M.D.   On: 06/05/2016 18:37   [name] Contrast  Result Date: 06/06/2016 CLINICAL DATA:  76 year old female with left side weakness and slurred speech for 1 day. Acute onset dizziness. EXAM: MRI HEAD WITHOUT CONTRAST TECHNIQUE: Multiplanar, multiecho pulse sequences of the brain and surrounding structures were obtained without intravenous contrast. COMPARISON:  Head CT without contrast 06/05/2016. FINDINGS: Brain: Oval 9 mm focus of restricted diffusion at the posterior limb of the right internal capsule (series 5, image 72). Subtle T2 hyperintensity. No associated hemorrhage or mass effect. No other restricted diffusion. No midline shift,  mass effect, evidence of mass lesion, ventriculomegaly, extra-axial collection or acute intracranial hemorrhage. Cervicomedullary junction and pituitary are within normal limits. Small chronic lacunar infarct in the right inferior cerebellum (series 9, image 5). Mild for age mostly periventricular white matter T2 and FLAIR hyperintensity. Negative brainstem. Deep gray matter nuclei are within normal limits. No cortical encephalomalacia or chronic cerebral blood products. Vascular: Major intracranial vascular flow voids are preserved. Skull and upper cervical spine: Negative. Sinuses/Orbits: Negative. Other: Mild right inferior mastoid effusion. Negative nasopharynx. Visible internal auditory structures appear normal. Left mastoids are clear. Negative scalp soft tissues. IMPRESSION: 1. Acute lacunar infarct in the posterior limb of the right internal capsule. No associated hemorrhage or mass effect. 2. Otherwise minimal to mild for age chronic small vessel disease. 3. Trace right mastoid fluid, likely postinfectious and inconsequential. Electronically Signed   By: Genevie Ann M.D.   On: 06/06/2016 10:28   US Carotid Bilateral (at [facility] And [facility] Only)  Result Date: 06/06/2016 CLINICAL DATA:  Acute CVA. Acute infarct in the posterior limb of the right internal capsule. EXAM: BILATERAL CAROTID DUPLEX ULTRASOUND TECHNIQUE: Pearline Cables scale imaging, color Doppler and duplex ultrasound were performed of bilateral carotid and vertebral arteries in the  neck. COMPARISON:  Brain MRI 06/06/2016 FINDINGS: Criteria: Quantification of carotid stenosis is based on velocity parameters that correlate the residual internal carotid diameter with NASCET-based stenosis levels, using the diameter of the distal internal carotid lumen as the denominator for stenosis measurement. The following velocity measurements were obtained: RIGHT ICA:  45 cm/sec CCA:  50 cm/sec SYSTOLIC ICA/CCA RATIO:  0.9 DIASTOLIC ICA/CCA RATIO:  1.5 ECA:  45 cm/sec LEFT ICA:   41 cm/sec CCA:  56 cm/sec SYSTOLIC ICA/CCA RATIO:  0.7 DIASTOLIC ICA/CCA RATIO:  1.1 ECA:  41 cm/sec RIGHT CAROTID ARTERY: Small amount of plaque and intimal thickening in the distal common carotid artery. No significant stenosis. Minimal disease at the right carotid bulb. External carotid artery is patent with normal waveform. Normal waveforms and velocities in the internal carotid artery. RIGHT VERTEBRAL ARTERY: Antegrade flow and normal waveform in the right vertebral artery. LEFT CAROTID ARTERY: Intimal thickening in the left common carotid artery. Minimal plaque at the left carotid bulb. External carotid artery is patent with normal waveform. Small amount of echogenic plaque in the proximal internal carotid artery. Normal waveforms and velocities in the internal carotid artery. LEFT VERTEBRAL ARTERY: Antegrade flow and normal waveform in the left vertebral artery. IMPRESSION: Minimal atherosclerotic disease in the carotid arteries. No significant stenosis. Estimated degree of stenosis in the internal carotid arteries is less than 50% bilaterally. Patent vertebral arteries with antegrade flow. Electronically Signed   By: Markus Daft M.D.   On: 06/06/2016 10:57   Mr Jodene Nam Head/brain ZJ Cm  Result Date: 06/06/2016 CLINICAL DATA:  76 year old female with left side weakness and slurred speech for 1 day. Acute onset dizziness. Acute lacunar infarct right internal capsule. EXAM: MRA HEAD WITHOUT CONTRAST TECHNIQUE: Angiographic images of the Circle of Willis were obtained using MRA technique without intravenous contrast. COMPARISON:  Brain MRI from today reported separately. FINDINGS: Antegrade flow in the posterior circulation. Mild distal vertebral artery irregularity without stenosis. Fairly codominant distal vertebral arteries. Normal right PICA origin. Patent vertebrobasilar junction with mild tortuosity. No basilar stenosis. AICA and PCA origins are normal. Posterior communicating arteries are diminutive or  absent. Left PCA branches are within normal limits. There is very mild right PCA distal P1 irregularity. No right PCA stenosis. Antegrade flow in both ICA siphons. Irregularity and stenosis at the right anterior genu is probably exaggerated by paranasal sinus susceptibility artifact. No left ICA siphon stenosis suspected. Ophthalmic artery origins are within normal limits. Normal carotid termini. Normal MCA and ACA origins. Diminutive anterior communicating artery. Visible ACA branches are within normal limits. Visible bilateral MCAs and MCA branches are within normal limits. IMPRESSION: Mild for age intracranial atherosclerosis. No large vessel or circle of Willis branch occlusion, and no significant intracranial stenosis suspected. Electronically Signed   By: Genevie Ann M.D.   On: 06/06/2016 10:32    (Echo, Carotid, EGD, Colonoscopy, ERCP)    Subjective:   Discharge Exam: Vitals:   06/07/16 0300 06/07/16 0700  BP: 140/74 129/86  Pulse: 60 60  Resp: 15 16  Temp: 98.1 F (36.7 C) 97.8 F (36.6 C)   Vitals:   06/06/16 1901 06/06/16 2300 06/07/16 0300 06/07/16 0700  BP: (!) 156/93 (!) 166/97 140/74 129/86  Pulse:  66 60 60  Resp:  16 15 16   Temp:  97.6 F (36.4 C) 98.1 F (36.7 C) 97.8 F (36.6 C)  TempSrc:  Oral Oral Oral  SpO2:  99% 96% 97%  Weight:      Height:  General: Pt is alert, awake, not in acute distress Cardiovascular: RRR, S1/S2 +, no rubs, no gallops Respiratory: CTA bilaterally, no wheezing, no rhonchi Abdominal: Soft, NT, ND, bowel sounds + Extremities: no edema, no cyanosis    The results of significant diagnostics from this hospitalization (including imaging, microbiology, ancillary and laboratory) are listed below for reference.     Microbiology: No results found for this or any previous visit (from the past 240 hour(s)).   Labs: BNP (last 3 results) No results for input(s): BNP in the last 8760 hours. Basic Metabolic Panel:  Recent Labs Lab  06/05/16 1736  NA 139  K 3.8  CL 101  CO2 30  GLUCOSE 102*  BUN 15  CREATININE 0.60  CALCIUM 9.6   Liver Function Tests:  Recent Labs Lab 06/05/16 1736  AST 24  ALT 20  ALKPHOS 56  BILITOT 0.7  PROT 6.8  ALBUMIN 4.3   No results for input(s): LIPASE, AMYLASE in the last 168 hours. No results for input(s): AMMONIA in the last 168 hours. CBC:  Recent Labs Lab 06/05/16 1736  WBC 9.0  NEUTROABS 4.8  HGB 13.7  HCT 41.6  MCV 91.0  PLT 139*   Cardiac Enzymes: No results for input(s): CKTOTAL, CKMB, CKMBINDEX, TROPONINI in the last 168 hours. BNP: Invalid input(s): POCBNP CBG:  Recent Labs Lab 06/05/16 1717  GLUCAP 96   D-Dimer No results for input(s): DDIMER in the last 72 hours. Hgb A1c  Recent Labs  06/06/16 0546  HGBA1C 5.6   Lipid Profile  Recent Labs  06/06/16 0549  CHOL 238*  HDL 63  LDLCALC 163*  TRIG 61  CHOLHDL 3.8   Thyroid function studies No results for input(s): TSH, T4TOTAL, T3FREE, THYROIDAB in the last 72 hours.  Invalid input(s): FREET3 Anemia work up No results for input(s): VITAMINB12, FOLATE, FERRITIN, TIBC, IRON, RETICCTPCT in the last 72 hours. Urinalysis    Component Value Date/Time   COLORURINE STRAW (A) 06/05/2016 2045   APPEARANCEUR CLEAR 06/05/2016 2045   LABSPEC 1.004 (L) 06/05/2016 2045   PHURINE 8.0 06/05/2016 2045   GLUCOSEU NEGATIVE 06/05/2016 2045   HGBUR NEGATIVE 06/05/2016 2045   BILIRUBINUR NEGATIVE 06/05/2016 2045   KETONESUR NEGATIVE 06/05/2016 2045   PROTEINUR NEGATIVE 06/05/2016 2045   NITRITE NEGATIVE 06/05/2016 2045   LEUKOCYTESUR TRACE (A) 06/05/2016 2045   Sepsis Labs Invalid input(s): PROCALCITONIN,  WBC,  LACTICIDVEN Microbiology No results found for this or any previous visit (from the past 240 hour(s)).   Time coordinating discharge: Over 30 minutes  SIGNED:   Elmarie Shiley, MD  [facility] 06/07/2016, 10:12 AM Pager [phone]  If 7PM-7AM, please contact  night-coverage [email or web address] Password [email or web address]

## 2016-06-08 NOTE — Progress Notes (Signed)
Patient discharged with instructions given on medications and follow up visits,patient verbalized understanding. Prescriptions sent to Pharmacy of choice documented on AVS. Stroke educational booklet given. Staff accompanied patient to awaiting vehicle.

## 2016-06-14 ENCOUNTER — Telehealth (HOSPITAL_COMMUNITY): Payer: Self-pay | Admitting: Physical Therapy

## 2016-06-14 ENCOUNTER — Ambulatory Visit (HOSPITAL_COMMUNITY): Payer: Medicare Other | Attending: Internal Medicine | Admitting: Physical Therapy

## 2016-06-14 DIAGNOSIS — M6281 Muscle weakness (generalized): Secondary | ICD-10-CM | POA: Insufficient documentation

## 2016-06-14 NOTE — Patient Instructions (Addendum)
Toe Raise (Sitting)    Raise toes, keeping heels on floor. Repeat _10-15___ times per set. Do __1__ sets per session. Do __3__ sessions per day.  http://orth.exer.us/46   Copyright  VHI. All rights reserved.  Dorsiflexion: Resisted    Facing anchor, tubing around left foot, pull toward face. Repeat to the right Repeat 10-15____ times per set. Do _1___ sets per session. Do __2__ sessions per day.  http://orth.exer.us/8   Copyright  VHI. All rights reserved.  Heel Raise: Bilateral (Standing)    Rise on balls of feet. Repeat __15__ times per set. Do ___1_ sets per session. Do ___3_ sessions per day.  http://orth.exer.us/38   Copyright  VHI. All rights reserved.  Balance: Unilateral   At a kitchen counter Attempt to balance on left leg, eyes open. Hold _up to 30 second ___ seconds. Repeat __5__ times per set. Do _1___ sets per session. Do __2__ sessions per day. Perform exercise with eyes closed.  http://orth.exer.us/28   Copyright  VHI. All rights reserved.

## 2016-06-14 NOTE — Therapy (Signed)
[facility] [address] [place], [place], [address] Phone: [phone]   Fax:  [phone]  Physical Therapy Evaluation  Patient Details  Name: Kimberly Woods MRN: [id number] Date of Birth: May 02, 1940 Referring Provider: Elmarie Shiley  Encounter Date: 06/14/2016      PT End of Session - 06/14/16 1154    Visit Number 1   Number of Visits 1   Authorization Type [employer] medicare   PT Start Time 0905   PT Stop Time 0945   PT Time Calculation (min) 40 min   Activity Tolerance Patient tolerated treatment well   Behavior During Therapy [facility] for tasks assessed/performed      Past Medical History:  Diagnosis Date  . Allergy   . Blood transfusion without reported diagnosis    after childbirth  . Hypertension   . Osteoporosis     Past Surgical History:  Procedure Laterality Date  . APPENDECTOMY  1963  . TONSILLECTOMY  1949  . TUBAL LIGATION  1974  . VAGINAL HYSTERECTOMY      There were no vitals filed for this visit.       Subjective Assessment - 06/14/16 0904    Subjective Kimberly Woods states that she woke up on 4/25 and noticed that she had slurred speech and was weak on her Lt side.  She went to the ER where she was admitted for a stroke and discharged to home.  She is now being referred to out patient therapy.  She states that since she has been home she has noticed that she just seems to be slower doing her activities.  She is not going out to get the groceries, driven or done laundry, swept or mopped.     Pertinent History CVA,    Limitations Lifting;House hold activities   How long can you sit comfortably? no problem   How long can you stand comfortably? 15-20 minutes    How long can you walk comfortably? 10-12 minutes at least three times a day.     Patient Stated Goals To be back to normal    Currently in Pain? No/denies            [facility] PT Assessment - 06/14/16 0001      Assessment   Medical Diagnosis Rt  lunar infarct    Referring Provider Zack Hall   Onset Date/Surgical Date 06/05/16   Next MD Visit unscheduled    Prior Therapy none      Precautions   Precautions None     Restrictions   Weight Bearing Restrictions No     Balance Screen   Has the patient fallen in the past 6 months No   Has the patient had a decrease in activity level because of a fear of falling?  Yes   Is the patient reluctant to leave their home because of a fear of falling?  Yes     Home Environment   Living Environment Private residence   Home Access Stairs to enter   [place] to live on main level with bedroom/bathroom     Prior Function   Level of Independence Independent     Cognition   Overall Cognitive Status Within Functional Limits for tasks assessed     Observation/Other Assessments   Focus on Therapeutic Outcomes (FOTO)  62     Functional Tests   Functional tests Single leg stance;Sit to Stand     Single Leg Stance   Comments Rt  12.55; LT 21.42      Sit to Stand   Comments 5x 8.83 norm for age 42.6      ROM / Strength   AROM / PROM / Strength Strength     Strength   Strength Assessment Site Hip;Knee;Ankle   Right/Left Hip Right;Left   Right Hip Flexion 5/5   Right Hip Extension 5/5   Right Hip ABduction 5/5   Left Hip Flexion 5/5   Left Hip Extension 5/5   Left Hip ABduction 5/5   Right/Left Knee Right;Left   Right Knee Flexion 4/5   Right Knee Extension 5/5   Left Knee Flexion 4/5   Left Knee Extension 5/5   Right/Left Ankle Right;Left   Right Ankle Dorsiflexion 3/5   Right Ankle Plantar Flexion 5/5   Left Ankle Dorsiflexion 3+/5   Left Ankle Plantar Flexion 5/5     Standardized Balance Assessment   Standardized Balance Assessment Dynamic Gait Index     Dynamic Gait Index   Level Surface Normal   Change in Gait Speed Normal   Gait with Horizontal Head Turns Normal   Gait with Vertical Head Turns Normal   Gait and Pivot Turn Normal   Step Over Obstacle  Normal   Step Around Obstacles Normal   Steps Normal   Total Score 24                           PT Education - 06/14/16 1154    Education provided Yes   Education Details HEP to address deficits   Person(s) Educated Patient   Methods Explanation;Handout   Comprehension Verbalized understanding;Returned demonstration          PT Short Term Goals - 06/14/16 1159      PT SHORT TERM GOAL #1   Title Pt to return to all prior activites including mopping, dusting and vacuuming   Time 2   Period Weeks   Status New                  Plan - 06/14/16 1155    Clinical Impression Statement Kimberly Woods is a 76 yo female who sustained a right Lunar infarct on 06/05/2016.   She was released home and referred to out patient therapy.  At this time Kimberly Woods states that she feels that she is back doing her normal activity she is just a little slower.   Examination demonstrated decreased balance and decreased anterior tibialis strength bilaterally.  Kimberly Woods deficits do not warrent out-patient therapy therefore a HEP was given to her to work on her existing deficits at [employer].    Rehab Potential Excellent   PT Frequency 1x / week   PT Duration --  1 week    PT Treatment/Interventions Balance training   PT Next Visit Plan Discharge to HEP    PT Home Exercise Plan SLS, heel raises, sitting dorsiflexion and t-band resisted dorsiflexion.    Consulted and Agree with Plan of Care Patient      Patient will benefit from skilled therapeutic intervention in order to improve the following deficits and impairments:  Decreased balance  Visit Diagnosis: Muscle weakness (generalized) - Plan: PT plan of care cert/re-cert      G-Codes - 81/01/75 1201    Functional Assessment Tool Used (Outpatient Only) clinical judgement as well as foto   Functional Limitation Mobility: Walking and moving around   Mobility: Walking and Moving Around Current Status (Z0258) At least  1  percent but less than 20 percent impaired, limited or restricted   Mobility: Walking and Moving Around Goal Status [phone]) At least 1 percent but less than 20 percent impaired, limited or restricted   Mobility: Walking and Moving Around Discharge Status [phone]) At least 1 percent but less than 20 percent impaired, limited or restricted       Problem List Patient Active Problem List   Diagnosis Date Noted  . CVA (cerebral vascular accident) ([place]) 06/05/2016  . Hypertension 06/05/2016    Rayetta Humphrey, PT CLT [phone] 06/14/2016, 12:04 PM  [facility] [address] [place], [place], [address] Phone: [phone]   Fax:  [phone]  Name: Kimberly Woods MRN: [id number] Date of Birth: January 26, 1941

## 2016-06-14 NOTE — Telephone Encounter (Signed)
Referral was made by [facility], RN that assigned the order to Dr. Aline Brochure. This is incorrect as the patient has never seen Dr. Aline Brochure and the order MD is Belky A Regalado. I made a call to Jolene Provost @[phone]  to make aware of error with purpose of open communication. Janett Billow agreed that I could change to correct MD; my manager agreed also.

## 2016-09-26 DIAGNOSIS — H35033 Hypertensive retinopathy, bilateral: Secondary | ICD-10-CM | POA: Diagnosis not present

## 2016-09-26 DIAGNOSIS — I10 Essential (primary) hypertension: Secondary | ICD-10-CM | POA: Diagnosis not present

## 2016-09-26 DIAGNOSIS — H52223 Regular astigmatism, bilateral: Secondary | ICD-10-CM | POA: Diagnosis not present

## 2016-09-26 DIAGNOSIS — H524 Presbyopia: Secondary | ICD-10-CM | POA: Diagnosis not present

## 2016-09-26 DIAGNOSIS — H2513 Age-related nuclear cataract, bilateral: Secondary | ICD-10-CM | POA: Diagnosis not present

## 2016-09-26 DIAGNOSIS — H43811 Vitreous degeneration, right eye: Secondary | ICD-10-CM | POA: Diagnosis not present

## 2016-09-26 DIAGNOSIS — H5203 Hypermetropia, bilateral: Secondary | ICD-10-CM | POA: Diagnosis not present

## 2016-09-26 DIAGNOSIS — H04123 Dry eye syndrome of bilateral lacrimal glands: Secondary | ICD-10-CM | POA: Diagnosis not present

## 2016-12-09 ENCOUNTER — Inpatient Hospital Stay (HOSPITAL_COMMUNITY): Admission: RE | Admit: 2016-12-09 | Payer: Medicare Other | Source: Ambulatory Visit

## 2016-12-09 NOTE — Patient Instructions (Addendum)
Kimberly Woods  12/09/2016     @PREFPERIOPPHARMACY @   Your procedure is scheduled on 12/13/2016.  Report to [facility] at 6:30 A.M.  Call this number if you have problems the morning of surgery:  [phone]   Remember:  Do not eat food or drink liquids after midnight.  Take these medicines the morning of surgery with A SIP OF WATER : Zyrtec   Do not wear jewelry, make-up or nail polish.  Do not wear lotions, powders, or perfumes, or deoderant.  Do not shave 48 hours prior to surgery.  Men may shave face and neck.  Do not bring valuables to the hospital.  [facility] is not responsible for any belongings or valuables.  Contacts, dentures or bridgework may not be worn into surgery.  Leave your suitcase in the car.  After surgery it may be brought to your room.  For patients admitted to the hospital, discharge time will be determined by your treatment team.  Patients discharged the day of surgery will not be allowed to drive home.   Name and phone number of your driver:   Family Special instructions:  n/a  Please read over the following fact sheets that you were given. Care and Recovery After Surgery    Cataract Surgery Cataract surgery is a procedure to remove a cataract from your eye. A cataract is cloudiness on the lens of your eye. The lens focuses light inside the eye. When a lens becomes cloudy, your vision is affected. Cataract surgery is a procedure to remove the cloudy lens. A substitute lens (intraocular lens or IOL) is usually inserted as a replacement for the cloudy lens. Tell a health care provider about:  Any allergies you have.  All medicines you are taking, including vitamins, herbs, eye drops, creams, and over-the-counter medicines.  Any problems you or family members have had with anesthetic medicines.  Any blood disorders you have.  Any surgeries you have had, especially eye surgeries that include refractive surgery, such as PRK and LASIK.  Any  medical conditions you have.  Whether you are pregnant or may be pregnant. What are the risks? Generally, this is a safe procedure. However, problems may occur, including:  Infection.  Bleeding.  Glaucoma.  Retinal detachment.  Allergic reactions to medicines.  Damage to other structures or organs.  Inflammation of the eye.  Clouding of the part of your eye that holds an IOL in place (after-cataract), if an IOL was inserted. This is fairly common.  An IOL moving out of position, if an IOL was inserted. This is very rare.  Loss of vision. This is rare.  What happens before the procedure?  Follow instructions from your health care provider about eating or drinking restrictions.  Ask your health care provider about: ? Changing or stopping your regular medicines, including any eye drops you have been prescribed. This is especially important if you are taking diabetes medicines or blood thinners. ? Taking medicines such as aspirin and ibuprofen. These medicines can thin your blood. Do not take these medicines before your procedure if your health care provider instructs you not to.  Do not put contact lenses in either eye on the day of your surgery.  Plan for someone to drive you to and from the procedure.  If you will be going home right after the procedure, plan to have someone with you for 24 hours. What happens during the procedure?  An IV tube may be inserted into one of your  veins.  You will be given one or more of the following: ? A medicine to help you relax (sedative). ? A medicine to numb the area (local anesthetic). This may be numbing eye drops or an injection that is given behind the eye.  A small cut (incision) will be made to the edge of the clear, dome-shaped surface that covers the front of the eye (cornea).  A small probe will be inserted into the eye. This device gives off ultrasound waves that soften and break up the cloudy center of the lens. This makes  it easier for the cloudy lens to be removed by suction.  An IOL may be implanted.  Part of the capsule that surrounds the lens will be left in the eye to support the IOL.  Your surgeon may use stitches (sutures) to close the incision. The procedure may vary among health care providers and hospitals. What happens after the procedure?  Your blood pressure, heart rate, breathing rate, and blood oxygen level will be monitored often until the medicines you were given have worn off.  You may be given a protective shield to wear over your eyes.  Do not drive for 24 hours if you received a sedative. This information is not intended to replace advice given to you by your health care provider. Make sure you discuss any questions you have with your health care provider. Document Released: 01/17/2011 Document Revised: 07/06/2015 Document Reviewed: 12/08/2014 Elsevier Interactive Patient Education  2017 [employer].

## 2016-12-10 ENCOUNTER — Encounter (HOSPITAL_COMMUNITY)
Admission: RE | Admit: 2016-12-10 | Discharge: 2016-12-10 | Disposition: A | Discharge status: Home / Self Care | Payer: Medicare Other | Source: Ambulatory Visit | Attending: Ophthalmology | Admitting: Ophthalmology

## 2016-12-10 ENCOUNTER — Encounter (HOSPITAL_COMMUNITY): Payer: Self-pay

## 2016-12-10 DIAGNOSIS — S0531XA Ocular laceration without prolapse or loss of intraocular tissue, right eye, initial encounter: Secondary | ICD-10-CM | POA: Diagnosis not present

## 2016-12-10 DIAGNOSIS — Z79899 Other long term (current) drug therapy: Secondary | ICD-10-CM | POA: Diagnosis not present

## 2016-12-10 DIAGNOSIS — Z8673 Personal history of transient ischemic attack (TIA), and cerebral infarction without residual deficits: Secondary | ICD-10-CM | POA: Diagnosis not present

## 2016-12-10 DIAGNOSIS — I10 Essential (primary) hypertension: Secondary | ICD-10-CM | POA: Diagnosis not present

## 2016-12-10 DIAGNOSIS — X58XXXA Exposure to other specified factors, initial encounter: Secondary | ICD-10-CM | POA: Diagnosis not present

## 2016-12-10 DIAGNOSIS — H269 Unspecified cataract: Secondary | ICD-10-CM | POA: Diagnosis present

## 2016-12-10 HISTORY — DX: Cerebral infarction, unspecified: I63.9

## 2016-12-10 HISTORY — DX: Pure hypercholesterolemia, unspecified: E78.00

## 2016-12-10 LAB — BASIC METABOLIC PANEL
ANION GAP: 9 (ref 5–15)
BUN: 15 mg/dL (ref 6–20)
CO2: 30 mmol/L (ref 22–32)
Calcium: 9.4 mg/dL (ref 8.9–10.3)
Chloride: 102 mmol/L (ref 101–111)
Creatinine, Ser: 0.65 mg/dL (ref 0.44–1.00)
GFR calc Af Amer: >60 mL/min (ref >60)
GLUCOSE: 119 mg/dL — AB (ref 65–99)
POTASSIUM: 3.5 mmol/L (ref 3.5–5.1)
Sodium: 141 mmol/L (ref 135–145)

## 2016-12-10 LAB — CBC WITH DIFFERENTIAL/PLATELET
BASOS ABS: 0 10*3/uL (ref 0.0–0.1)
Basophils Relative: 0 %
Eosinophils Absolute: 0.2 10*3/uL (ref 0.0–0.7)
Eosinophils Relative: 2 %
HEMATOCRIT: 39.1 % (ref 36.0–46.0)
HEMOGLOBIN: 13.1 g/dL (ref 12.0–15.0)
LYMPHS ABS: 2.6 10*3/uL (ref 0.7–4.0)
LYMPHS PCT: 37 %
MCH: 30.8 pg (ref 26.0–34.0)
MCHC: 33.5 g/dL (ref 30.0–36.0)
MCV: 92 fL (ref 78.0–100.0)
Monocytes Absolute: 0.5 10*3/uL (ref 0.1–1.0)
Monocytes Relative: 7 %
NEUTROS ABS: 3.7 10*3/uL (ref 1.7–7.7)
NEUTROS PCT: 54 %
PLATELETS: 225 10*3/uL (ref 150–400)
RBC: 4.25 MIL/uL (ref 3.87–5.11)
RDW: 12.7 % (ref 11.5–15.5)
WBC: 6.9 10*3/uL (ref 4.0–10.5)

## 2016-12-13 ENCOUNTER — Encounter (HOSPITAL_COMMUNITY): Payer: Self-pay | Admitting: Anesthesiology

## 2016-12-13 ENCOUNTER — Encounter (HOSPITAL_COMMUNITY)
Admission: RE | Disposition: A | Discharge status: Home / Self Care | Payer: Self-pay | Source: Ambulatory Visit | Attending: Ophthalmology

## 2016-12-13 ENCOUNTER — Ambulatory Visit (HOSPITAL_COMMUNITY): Payer: Medicare Other | Admitting: Anesthesiology

## 2016-12-13 ENCOUNTER — Ambulatory Visit (HOSPITAL_COMMUNITY)
Admission: RE | Admit: 2016-12-13 | Discharge: 2016-12-13 | Disposition: A | Discharge status: Home / Self Care | Payer: Medicare Other | Source: Ambulatory Visit | Attending: Ophthalmology | Admitting: Ophthalmology

## 2016-12-13 DIAGNOSIS — Z8673 Personal history of transient ischemic attack (TIA), and cerebral infarction without residual deficits: Secondary | ICD-10-CM | POA: Insufficient documentation

## 2016-12-13 DIAGNOSIS — S0531XA Ocular laceration without prolapse or loss of intraocular tissue, right eye, initial encounter: Secondary | ICD-10-CM | POA: Insufficient documentation

## 2016-12-13 DIAGNOSIS — X58XXXA Exposure to other specified factors, initial encounter: Secondary | ICD-10-CM | POA: Insufficient documentation

## 2016-12-13 DIAGNOSIS — I10 Essential (primary) hypertension: Secondary | ICD-10-CM | POA: Diagnosis not present

## 2016-12-13 DIAGNOSIS — H269 Unspecified cataract: Secondary | ICD-10-CM | POA: Insufficient documentation

## 2016-12-13 DIAGNOSIS — Z79899 Other long term (current) drug therapy: Secondary | ICD-10-CM | POA: Insufficient documentation

## 2016-12-13 HISTORY — PX: VITRECTOMY AND CATARACT: SHX6184

## 2016-12-13 SURGERY — EXTRACTION, CATARACT, WITH VITRECTOMY
Anesthesia: Monitor Anesthesia Care | Site: Eye | Laterality: Right

## 2016-12-13 MED ORDER — LIDOCAINE HCL 3.5 % OP GEL
1.0000 | Freq: Once | OPHTHALMIC | Status: AC
Start: 2016-12-13 — End: 2016-12-13
  Administered 2016-12-13: 1 via OPHTHALMIC

## 2016-12-13 MED ORDER — NA CHONDROIT SULF-NA HYALURON 40-30 MG/ML IO SOLN
INTRAOCULAR | Status: DC | PRN
  Administered 2016-12-13: 0.5 mL via INTRAOCULAR

## 2016-12-13 MED ORDER — MIDAZOLAM HCL 2 MG/2ML IJ SOLN
INTRAMUSCULAR | Status: AC
  Filled 2016-12-13: qty 2

## 2016-12-13 MED ORDER — BSS IO SOLN
INTRAOCULAR | Status: DC | PRN
  Administered 2016-12-13: 15 mL

## 2016-12-13 MED ORDER — EPINEPHRINE PF 1 MG/ML IJ SOLN
INTRAOCULAR | Status: DC | PRN
  Administered 2016-12-13: 1 mL via OPHTHALMIC

## 2016-12-13 MED ORDER — POVIDONE-IODINE 5 % OP SOLN
OPHTHALMIC | Status: DC | PRN
  Administered 2016-12-13: 1 via OPHTHALMIC

## 2016-12-13 MED ORDER — TETRACAINE HCL 0.5 % OP SOLN
1.0000 [drp] | OPHTHALMIC | Status: AC
  Administered 2016-12-13 (×3): 1 [drp] via OPHTHALMIC

## 2016-12-13 MED ORDER — EPINEPHRINE PF 1 MG/ML IJ SOLN
INTRAMUSCULAR | Status: DC | PRN
  Administered 2016-12-13: 500 mL

## 2016-12-13 MED ORDER — SODIUM HYALURONATE 23 MG/ML IO SOLN
INTRAOCULAR | Status: DC | PRN
  Administered 2016-12-13: 0.6 mL via INTRAOCULAR

## 2016-12-13 MED ORDER — LACTATED RINGERS IV SOLN
INTRAVENOUS | Status: DC
  Administered 2016-12-13: 1000 mL via INTRAVENOUS

## 2016-12-13 MED ORDER — CYCLOPENTOLATE-PHENYLEPHRINE 0.2-1 % OP SOLN
1.0000 [drp] | OPHTHALMIC | Status: AC
  Administered 2016-12-13 (×3): 1 [drp] via OPHTHALMIC

## 2016-12-13 MED ORDER — MIDAZOLAM HCL 2 MG/2ML IJ SOLN
1.0000 mg | INTRAMUSCULAR | Status: AC
Start: 2016-12-13 — End: 2016-12-13
  Administered 2016-12-13: 2 mg via INTRAVENOUS

## 2016-12-13 MED ORDER — FENTANYL CITRATE (PF) 100 MCG/2ML IJ SOLN
INTRAMUSCULAR | Status: AC
  Filled 2016-12-13: qty 2

## 2016-12-13 MED ORDER — PROVISC 10 MG/ML IO SOLN
INTRAOCULAR | Status: DC | PRN
  Administered 2016-12-13: 0.85 mL via INTRAOCULAR

## 2016-12-13 MED ORDER — EPINEPHRINE PF 1 MG/ML IJ SOLN
INTRAMUSCULAR | Status: AC
  Filled 2016-12-13: qty 2

## 2016-12-13 MED ORDER — FENTANYL CITRATE (PF) 100 MCG/2ML IJ SOLN
25.0000 ug | Freq: Once | INTRAMUSCULAR | Status: AC
  Administered 2016-12-13: 25 ug via INTRAVENOUS

## 2016-12-13 MED ORDER — NEOMYCIN-POLYMYXIN-DEXAMETH 3.5-10000-0.1 OP SUSP
OPHTHALMIC | Status: DC | PRN
Start: 2016-12-13 — End: 2016-12-13
  Administered 2016-12-13: 2 [drp] via OPHTHALMIC

## 2016-12-13 MED ORDER — PHENYLEPHRINE HCL 2.5 % OP SOLN
1.0000 [drp] | OPHTHALMIC | Status: AC
  Administered 2016-12-13 (×3): 1 [drp] via OPHTHALMIC

## 2016-12-13 SURGICAL SUPPLY — 15 items
CLOTH BEACON ORANGE TIMEOUT ST (SAFETY) ×3 IMPLANT
EYE SHIELD UNIVERSAL CLEAR (GAUZE/BANDAGES/DRESSINGS) ×3 IMPLANT
GLOVE BIOGEL PI IND STRL 6.5 (GLOVE) ×1 IMPLANT
GLOVE BIOGEL PI IND STRL 7.0 (GLOVE) ×1 IMPLANT
GLOVE BIOGEL PI INDICATOR 6.5 (GLOVE) ×2
GLOVE BIOGEL PI INDICATOR 7.0 (GLOVE) ×2
PACK VIT ANT 23G (MISCELLANEOUS) ×3 IMPLANT
PAD ARMBOARD 7.5X6 YLW CONV (MISCELLANEOUS) ×3 IMPLANT
PROC W NO LENS (INTRAOCULAR LENS) ×4
PROCESS W NO LENS (INTRAOCULAR LENS) ×1 IMPLANT
SYR TB 1ML LL NO SAFETY (SYRINGE) ×3 IMPLANT
TAPE SURG TRANSPORE 1 IN (GAUZE/BANDAGES/DRESSINGS) ×1 IMPLANT
TAPE SURGICAL TRANSPORE 1 IN (GAUZE/BANDAGES/DRESSINGS) ×2
VISCOELASTIC ADDITIONAL (OPHTHALMIC RELATED) ×6 IMPLANT
WATER STERILE IRR 250ML POUR (IV SOLUTION) ×3 IMPLANT

## 2016-12-13 NOTE — Op Note (Signed)
Date of procedure: 12/13/16  Pre-operative diagnosis: Visually significant cataract, Right Eye  Post-operative diagnosis: Visually significant cataract, Right Eye; anterior capsular tear with posterior radiation, right eye  Procedure: Removal of cataract via phacoemulsification; Anterior vitrectomy, Right Eye  Attending surgeon: Gerda Diss. Eyva Califano, MD, MA  Anesthesia: MAC, Topical [place]  Complications: None  Estimated Blood Loss: <60m (minimal)  Specimens: None  Implants: As above  Indications:  Visually significant cataract, Right Eye  Procedure:  The patient was seen and identified in the pre-operative area. The operative eye was identified and dilated.  The operative eye was marked.  Topical anesthesia was administered to the operative eye.     The patient was then to the operative suite and placed in the supine position.  A timeout was performed confirming the patient, procedure to be performed, and all other relevant information.   The patient's face was prepped and draped in the usual fashion for intra-ocular surgery.  A lid speculum was placed into the operative eye and the surgical microscope moved into place and focused.  A superotemporal paracentesis was created using a 20 gauge paracentesis blade.  Shugarcaine was injected into the anterior chamber.  Viscoelastic was injected into the anterior chamber.  A temporal clear-corneal main wound incision was created using a 2.470mmicrokeratome.  A continuous curvilinear capsulorrhexis was initiated using an irrigating cystitome and completed using capsulorrhexis forceps.  Hydrodissection and hydrodeliniation were performed.  Viscoelastic was injected into the anterior chamber.  A phacoemulsification handpiece and a chopper as a second instrument were used to remove the nucleus and epinucleus. During nulcear removal, the nucleus was noted to become unstable consistent with a posterior capsular rupture.  Viscoat was injected in the eye.  A  careful anterior vitrectomy was performed.  Due to the loss of capsular support, no lens could be placed at this time.  A fragment of the nucleus was noted to descend into the vitreous.  The clear corneal wound and paracentesis wounds were then hydrated and checked with Weck-Cels to be watertight.  The lid-speculum and drape was removed, and the patient's face was cleaned with a wet and dry 4x4.  Maxitrol was instilled in the eye before a clear shield was taped over the eye. The patient was taken to the post-operative care unit in good condition, having tolerated the procedure well.  Post-Op Instructions: [facility] and will receive all other orders and instructions.

## 2016-12-13 NOTE — Discharge Instructions (Signed)
Please discharge patient when stable, will follow up today with Dr. Marisa Hua at the [facility] office at 11:30AM today.  Leave shield in place until visit.  All paperwork with discharge instructions will be given at the office.   Cataract Surgery, Care After Refer to this sheet in the next few weeks. These instructions provide you with information about caring for yourself after your procedure. Your health care provider may also give you more specific instructions. Your treatment has been planned according to current medical practices, but problems sometimes occur. Call your health care provider if you have any problems or questions after your procedure. What can I expect after the procedure? After the procedure, it is common to have:  Itching.  Discomfort.  Fluid discharge.  Sensitivity to light and to touch.  Bruising.  Follow these instructions at home: [place] your eye every day for signs of infection. Watch for: ? Redness, swelling, or pain. ? Fluid, blood, or pus. ? Warmth. ? Bad smell. Activity  Avoid strenuous activities, such as playing contact sports, for as long as told by your health care provider.  Do not drive or operate heavy machinery until your health care provider approves.  Do not bend or lift heavy objects. Bending increases pressure in the eye. You can walk, climb stairs, and do light household chores.  Ask your health care provider when you can return to work. If you work in a dusty environment, you may be advised to wear protective eyewear for a period of time. General instructions  Take or apply over-the-counter and prescription medicines only as told by your health care provider. This includes eye drops.  Do not touch or rub your eyes.  If you were given a protective shield, wear it as told by your health care provider. If you were not given a protective shield, wear sunglasses as told by your health care provider to protect your  eyes.  Keep the area around your eye clean and dry. Avoid swimming or allowing water to hit you directly in the face while showering until told by your health care provider. Keep soap and shampoo out of your eyes.  Do not put a contact lens into the affected eye or eyes until your health care provider approves.  Keep all follow-up visits as told by your health care provider. This is important. Contact a health care provider if:   You have increased bruising around your eye.  You have pain that is not helped with medicine.  You have a fever.  You have redness, swelling, or pain in your eye.  You have fluid, blood, or pus coming from your incision.  Your vision gets worse. Get help right away if:  You have sudden vision loss. This information is not intended to replace advice given to you by your health care provider. Make sure you discuss any questions you have with your health care provider. Document Released: 08/17/2004 Document Revised: 06/08/2015 Document Reviewed: 12/08/2014 Elsevier Interactive Patient Education  2017 [employer]   PATIENT INSTRUCTIONS POST-ANESTHESIA  IMMEDIATELY FOLLOWING SURGERY:  Do not drive or operate machinery for the first twenty four hours after surgery.  Do not make any important decisions for twenty four hours after surgery or while taking narcotic pain medications or sedatives.  If you develop intractable nausea and vomiting or a severe headache please notify your doctor immediately.  FOLLOW-UP:  Please make an appointment with your surgeon as instructed. You do not need to follow up  with anesthesia unless specifically instructed to do so.  WOUND CARE INSTRUCTIONS (if applicable):  Keep a dry clean dressing on the anesthesia/puncture wound site if there is drainage.  Once the wound has quit draining you may leave it open to air.  Generally you should leave the bandage intact for twenty four hours unless there is drainage.  If the epidural site  drains for more than 36-48 hours please call the anesthesia department.  QUESTIONS?:  Please feel free to call your physician or the hospital operator if you have any questions, and they will be happy to assist you.

## 2016-12-13 NOTE — Transfer of Care (Signed)
Immediate Anesthesia Transfer of Care Note  Patient: Kimberly Woods  Procedure(s) Performed: ANTERIOR VITRECTOMY AND CATARACT EXTRACTION RIGHT EYE CDE=18.58 (Right Eye)  Patient [name] stay  Anesthesia Type:MAC  Level of Consciousness: awake, alert , oriented and patient cooperative  Airway & Oxygen Therapy: Patient Spontanous Breathing  Post-op Assessment: Report given to RN and Post -op Vital signs reviewed and stable  Post vital signs: Reviewed and stable  Last Vitals:  Vitals:   12/13/16 0725 12/13/16 0730  BP: 128/75 111/72  Pulse:    Resp: (!) 23 14  Temp:    SpO2: 98% 94%    Last Pain: There were no vitals filed for this visit.       Complications: No apparent anesthesia complications

## 2016-12-13 NOTE — Anesthesia Preprocedure Evaluation (Signed)
Anesthesia Evaluation  Patient identified by MRN, date of birth, ID band Patient awake    Reviewed: Allergy & Precautions, NPO status , Patient's Chart, lab work & pertinent test results  Airway Mallampati: I  TM Distance: >3 FB     Dental  (+) Upper Dentures, Lower Dentures   Pulmonary neg pulmonary ROS,    breath sounds clear to auscultation       Cardiovascular hypertension, Pt. on medications  Rhythm:Regular Rate:Normal     Neuro/Psych CVA, No Residual Symptoms    GI/Hepatic negative GI ROS, Neg liver ROS,   Endo/Other  negative endocrine ROS  Renal/GU negative Renal ROS     Musculoskeletal   Abdominal   Peds  Hematology negative hematology ROS (+)   Anesthesia Other Findings   Reproductive/Obstetrics                             Anesthesia Physical Anesthesia Plan  ASA: III  Anesthesia Plan: MAC   Post-op Pain Management:    Induction: Intravenous  PONV Risk Score and Plan:   Airway Management Planned: Nasal Cannula  Additional Equipment:   Intra-op Plan:   Post-operative Plan:   Informed Consent: I have reviewed the patients History and Physical, chart, labs and discussed the procedure including the risks, benefits and alternatives for the proposed anesthesia with the patient or authorized representative who has indicated his/her understanding and acceptance.     Plan Discussed with:   Anesthesia Plan Comments:         Anesthesia Quick Evaluation

## 2016-12-13 NOTE — Anesthesia Postprocedure Evaluation (Signed)
Anesthesia Post Note  Patient: Kimberly Woods  Procedure(s) Performed: ANTERIOR VITRECTOMY AND CATARACT EXTRACTION RIGHT EYE CDE=18.58 (Right Eye)  Patient location during evaluation: Short Stay Anesthesia Type: MAC Level of consciousness: awake and alert, oriented and patient cooperative Pain management: pain level controlled Vital Signs Assessment: post-procedure vital signs reviewed and stable Respiratory status: spontaneous breathing Cardiovascular status: stable Postop Assessment: no apparent nausea or vomiting Anesthetic complications: no     Last Vitals:  Vitals:   12/13/16 0725 12/13/16 0730  BP: 128/75 111/72  Pulse:    Resp: (!) 23 14  Temp:    SpO2: 98% 94%    Last Pain: There were no vitals filed for this visit.               ADAMS, AMY A

## 2016-12-13 NOTE — H&P (Signed)
The H and P was reviewed and updated. The patient was examined.  No changes were found after exam.  The surgical eye was marked.  

## 2016-12-16 ENCOUNTER — Encounter (HOSPITAL_COMMUNITY): Payer: Self-pay | Admitting: Ophthalmology

## 2017-08-26 ENCOUNTER — Other Ambulatory Visit: Payer: Self-pay

## 2017-08-26 ENCOUNTER — Ambulatory Visit (HOSPITAL_COMMUNITY): Payer: Medicare Other | Attending: Internal Medicine

## 2017-08-26 ENCOUNTER — Encounter (HOSPITAL_COMMUNITY): Payer: Self-pay

## 2017-08-26 DIAGNOSIS — M545 Low back pain, unspecified: Secondary | ICD-10-CM

## 2017-08-26 DIAGNOSIS — M25551 Pain in right hip: Secondary | ICD-10-CM | POA: Diagnosis present

## 2017-08-26 DIAGNOSIS — R262 Difficulty in walking, not elsewhere classified: Secondary | ICD-10-CM | POA: Insufficient documentation

## 2017-08-26 DIAGNOSIS — M6281 Muscle weakness (generalized): Secondary | ICD-10-CM | POA: Diagnosis present

## 2017-08-26 NOTE — Therapy (Signed)
[place], [place], [address] Phone: [phone]   Fax:  [phone]  Physical Therapy Evaluation  Patient Details  Name: Kimberly Woods MRN: [id number] Date of Birth: Sep 17, 1940 Referring Provider: Celene Squibb, MD   Encounter Date: 08/26/2017  PT End of Session - 08/26/17 1033    Visit Number  1    Number of Visits  5    Date for PT Re-Evaluation  09/23/17    Authorization Type  [employer] Medicare    Authorization Time Period  08/26/17 to 09/23/17    Authorization - Visit Number  1    Authorization - Number of Visits  10    PT Start Time  0943    PT Stop Time  1030    PT Time Calculation (min)  47 min    Activity Tolerance  Patient tolerated treatment well    Behavior During Therapy  [facility] for tasks assessed/performed       Past Medical History:  Diagnosis Date  . Allergy   . Blood transfusion without reported diagnosis    after childbirth  . Hypercholesterolemia 2018  . Hypertension   . Osteoporosis   . Stroke [facility]) 05/2016    Past Surgical History:  Procedure Laterality Date  . APPENDECTOMY  1963  . TONSILLECTOMY  1949  . TUBAL LIGATION  1974  . VAGINAL HYSTERECTOMY    . VITRECTOMY AND CATARACT Right 12/13/2016   Procedure: ANTERIOR VITRECTOMY AND CATARACT EXTRACTION RIGHT EYE CDE=18.58;  Surgeon: Baruch Goldmann, MD;  Location: [facility];  Service: Ophthalmology;  Laterality: Right;    There were no vitals filed for this visit.   Subjective Assessment - 08/26/17 0947    Subjective  Pt reports that her lower back has started hurting, which is causing her R hip to hurt. It has caused her to start limping. This started 3-4 weeks (maybe longer) of insidous onset. She has never had this pain before in her lower back or her hip. She does have hx of CVA and had L sided weakness but feels this has improved back to her baseline. She does report some intermittent toe numbness but this was going on way before this  back pain started. She denies any sharp shooting pain down into her RLE. She denies any b/b issues since this started. She reports difficulty with stairs, now has to ambulate step-to. She reports that laying on her R hip with increase her pain, but she is note sure what aggravates her back. If she sits for a long time she stiffens up and limps worse and her pain is typically worse by the end of the day.    How long can you sit comfortably?  no real issues sitting, it's the standing up after sitting    How long can you stand comfortably?  no issues    How long can you walk comfortably?  unsure    Patient Stated Goals  wants to know what she needs to do so that she can get rid of this    Currently in Pain?  No/denies         [facility] PT Assessment - 08/26/17 0001      Assessment   Medical Diagnosis  R leg weakness  hx of stroke, back pain (strenghtening and stretching LBP)    Referring Provider  Celene Squibb, MD    Onset Date/Surgical Date  -- 3-4 weeks ago    Next MD Visit  unsure,  couple of months maybe    Prior Therapy  many years ago for her toe numbness      Balance Screen   Has the patient fallen in the past 6 months  No    Has the patient had a decrease in activity level because of a fear of falling?   No    Is the patient reluctant to leave their home because of a fear of falling?   No      Prior Function   Level of Independence  Independent    Vocation  Retired    Leisure  go visit people, antiquing, see grandkids      Observation/Other Assessments   Focus on Therapeutic Outcomes (FOTO)   n/a as pt will likely only come for1 f/u visit due to financial reasons      ROM / Strength   AROM / PROM / Strength  AROM;Strength      AROM   AROM Assessment Site  Lumbar    Lumbar Flexion  25% limited, in lordosis; RFIS x10: increase in R hip    Lumbar Extension  25% limited; REIS x10: not as much in the hip, a little more in the back thought    Lumbar - Right Side Bend  knee joint     Lumbar - Left Side Bend  just above knee joint    Lumbar - Right Rotation  25% limited    Lumbar - Left Rotation  50% limited      Strength   Strength Assessment Site  Hip;Knee;Ankle    Right Hip Flexion  4/5    Right Hip Extension  3+/5 pain    Right Hip ABduction  4/5 pain    Left Hip Flexion  4+/5    Left Hip Extension  4-/5    Left Hip ABduction  4/5    Right Knee Flexion  4+/5    Right Knee Extension  4+/5    Left Knee Flexion  4+/5    Left Knee Extension  4+/5    Right Ankle Dorsiflexion  4+/5    Left Ankle Dorsiflexion  4+/5      Flexibility   Soft Tissue Assessment /Muscle Length  yes    Hamstrings  90/90: R: 127deg, L: 137deg    Quadriceps  +Ely's BLE, R recreated LBP      Palpation   Palpation comment  increased soft tissue restrictions of bil lumbar paraspinals, glutes (esp med), piriformis and recreation of pain with palaption      Balance   Balance Assessed  --      Static Standing Balance   Static Standing - Balance Support  --    Static Standing Balance -  Activities   --    Static Standing - Comment/# of Minutes  --            Objective measurements completed on examination: See above findings.          PT Education - 08/26/17 1032    Education Details  exam findings, POC, HEP; benefits of coming for at least 1 f/u visit to assess response to HEP and be able to provide more exercises     Person(s) Educated  Patient    Methods  Explanation;Demonstration;Handout    Comprehension  Verbalized understanding;Returned demonstration       PT Short Term Goals - 08/26/17 1044      PT SHORT TERM GOAL #1   Title  Pt will be independent with HEP and  perfom consistently in order to decrease pain and maximzie return to PLOF.     Time  1    Period  Weeks    Status  New    Target Date  09/02/17        PT Long Term Goals - 08/26/17 1045      PT LONG TERM GOAL #1   Title  Pt will report decreased pain overall to minimal and report being able to  ambulate stairs with greater ease to demo improved overall function.    Time  4    Period  Weeks    Status  New             Plan - 08/26/17 1033    Clinical Impression Statement  Pt is pleasant 77YO F who presents to OPPT with c/o subacute LBP and R hip pain. Pt presents with deficits in lumbar ROM, mild deficits in hip ROM, MMT (especially hip musculature), along with increased soft tissue restrictions of lumbar paraspinals, glutes, and piriformis. Pt also with tight HS and quads bil, R>L. Pt needs skilled PT intervention to address these impairments in order to decrease pain and maximize return to PLOF. Pt reporting restrictions financially and cannot come 2x/week for 4 weeks. Pt really only wants HEP to perform at home, but PT asked pt to come in for at least 1 f/u visit to assess her response to initial HEP so that any changes can be made as necessary and she agreed to this plan. PT requesting 1x/week for 4 weeks, but pt will like only utilize her first f/u visit and then will be on HEP POC for the remainder of it.    Clinical Presentation  Stable    Clinical Presentation due to:  lumbar ROM, MMT, soft tissue restrictions, core strength, functional strength, flexibility    Clinical Decision Making  Low    Rehab Potential  Fair    PT Frequency  1x / week    PT Duration  4 weeks    PT Treatment/Interventions  ADLs/Self Care Home Management;Aquatic Therapy;Cryotherapy;Electrical Stimulation;Moist Heat;Ultrasound;DME Instruction;Stair training;Gait training;Functional mobility training;Therapeutic activities;Therapeutic exercise;Balance training;Neuromuscular re-education;Patient/family education;Manual techniques;Passive range of motion;Taping    PT Next Visit Plan  review goals, ensure HEP went well and did not exacerbate pain; provide pt with core, hip, and functional strengthening HEP; add in prone quad stretch to HEP    PT Home Exercise Plan  eval: supine HS stretching (hands behind  thigh), SKTC, DKTC, piriformis stretch, seated lumbar flexion stretch, LTRs    Consulted and Agree with Plan of Care  Patient       Patient will benefit from skilled therapeutic intervention in order to improve the following deficits and impairments:  Decreased activity tolerance, Decreased balance, Decreased range of motion, Decreased strength, Difficulty walking, Hypomobility, Increased fascial restricitons, Increased muscle spasms, Impaired flexibility, Improper body mechanics, Pain  Visit Diagnosis: Acute bilateral low back pain without sciatica - Plan: PT plan of care cert/re-cert  Pain in right hip - Plan: PT plan of care cert/re-cert  Muscle weakness (generalized) - Plan: PT plan of care cert/re-cert  Difficulty in walking, not elsewhere classified - Plan: PT plan of care cert/re-cert     Problem List Patient Active Problem List   Diagnosis Date Noted  . CVA (cerebral vascular accident) ([place]) 06/05/2016  . Hypertension 06/05/2016       Geraldine Solar PT, [place] [address] [place], [place], [address] Phone:  [phone]   Fax:  [phone]  Name: Kimberly Woods MRN: [id number] Date of Birth: [birth year]

## 2017-08-26 NOTE — Patient Instructions (Signed)
Access Code: [email or web address]  URL: [email or web address]  Date: 08/26/2017  Prepared by: Geraldine Solar   Exercises Hip Flexion Stretch - 3-5 reps - 10-20 hold - 1-2x daily - 7x weekly Supine Double Knee to Chest - 3-5 reps - 15-20 hold - 1-2x daily - 7x weekly Supine Piriformis Stretch - 3-5 reps - 15-30 hold - 1-2x daily - 7x weekly Supine Hamstring Stretch - 5-10 reps - 10-15 hold - 1-2x daily - 7x weekly Seated Flexion Stretch - 3-5 reps - 10-15 hold - 1-2x daily - 7x weekly Supine Lower Trunk Rotation - 10-15 reps - 5-10 hold - 1-2x daily - 7x weekly

## 2017-09-01 ENCOUNTER — Telehealth (HOSPITAL_COMMUNITY): Payer: Self-pay

## 2017-09-01 ENCOUNTER — Encounter (HOSPITAL_COMMUNITY): Payer: Self-pay

## 2017-09-01 ENCOUNTER — Ambulatory Visit (HOSPITAL_COMMUNITY): Payer: Medicare Other

## 2017-09-01 DIAGNOSIS — M545 Low back pain, unspecified: Secondary | ICD-10-CM

## 2017-09-01 DIAGNOSIS — M25551 Pain in right hip: Secondary | ICD-10-CM

## 2017-09-01 DIAGNOSIS — M6281 Muscle weakness (generalized): Secondary | ICD-10-CM

## 2017-09-01 DIAGNOSIS — R262 Difficulty in walking, not elsewhere classified: Secondary | ICD-10-CM

## 2017-09-01 NOTE — Patient Instructions (Signed)
Access Code: [email or web address]  URL: [email or web address]  Date: 09/01/2017  Prepared by: Geraldine Solar   Exercises Prone Bent Leg Fallout - 10 reps - 3 sets - 1x daily - 7x weekly Prone Quadriceps Stretch with Strap - 10 reps - 3 sets - 1x daily - 7x weekly Seated Thoracic Lumbar Extension - 10 reps - 3 sets - 1x daily - 7x weekly Seated Sidebending - 10 reps - 3 sets - 1x daily - 7x weekly Seated Thoracic Flexion and Rotation with Arms Crossed - 10 reps - 3 sets - 1x daily - 7x weekly Side Stepping with Resistance at Ankles - 10 reps - 3 sets - 1x daily - 7x weekly Supine Bridge - 10 reps - 3 sets - 1x daily - 7x weekly Single Leg Stance - 10 reps - 3 sets - 1x daily - 7x weekly Mini Squat with Chair - 10 reps - 3 sets - 1x daily - 7x weekly Single Leg Stance with 3-Way Kick on Stair - 10 reps - 3 sets - 1x daily - 7x weekly Sidelying Hip Abduction on Wall - 10 reps - 3 sets - 1x daily - 7x weekly Supine Active Straight Leg Raise - 10 reps - 3 sets - 1x daily - 7x weekly

## 2017-09-01 NOTE — Therapy (Signed)
[facility] [place], [place], [address] Phone: [phone]   Fax:  [phone]  Physical Therapy Treatment  Patient Details  Name: Kimberly Woods MRN: [id number] Date of Birth: [birth year] Referring Provider: Celene Squibb, MD   Encounter Date: 09/01/2017  PT End of Session - 09/01/17 1116    Visit Number  2    Number of Visits  5    Date for PT Re-Evaluation  09/23/17    Authorization Type  [employer] Medicare    Authorization Time Period  08/26/17 to 09/23/17    Authorization - Visit Number  2    Authorization - Number of Visits  10    PT Start Time  1116    PT Stop Time  1157    PT Time Calculation (min)  41 min    Activity Tolerance  Patient tolerated treatment well    Behavior During Therapy  [facility] for tasks assessed/performed       Past Medical History:  Diagnosis Date  . Allergy   . Blood transfusion without reported diagnosis    after childbirth  . Hypercholesterolemia 2018  . Hypertension   . Osteoporosis   . Stroke [facility]) 05/2016    Past Surgical History:  Procedure Laterality Date  . APPENDECTOMY  1963  . TONSILLECTOMY  1949  . TUBAL LIGATION  1974  . VAGINAL HYSTERECTOMY    . VITRECTOMY AND CATARACT Right 12/13/2016   Procedure: ANTERIOR VITRECTOMY AND CATARACT EXTRACTION RIGHT EYE CDE=18.58;  Surgeon: Baruch Goldmann, MD;  Location: [facility];  Service: Ophthalmology;  Laterality: Right;    There were no vitals filed for this visit.  Subjective Assessment - 09/01/17 1116    Subjective  Pt reports that her back pain has decreased but it seems like more in her R hip now. She reports compliance with her HEP. She denies any pain during the HEP, only reporting a good stretch.     How long can you sit comfortably?  no real issues sitting, it's the standing up after sitting    How long can you stand comfortably?  no issues    How long can you walk comfortably?  unsure    Patient Stated Goals  wants to know what  she needs to do so that she can get rid of this    Currently in Pain?  Yes    Pain Score  5     Pain Location  Hip    Pain Orientation  Right    Pain Descriptors / Indicators  -- aggravating    Pain Type  Chronic pain    Pain Onset  More than a month ago    Pain Frequency  Constant    Aggravating Factors   WB, standing after sitting for a long time    Pain Relieving Factors  rest     Effect of Pain on Daily Activities  increase          [facility] Adult PT Treatment/Exercise - 09/01/17 0001      Exercises   Exercises  Lumbar      Lumbar Exercises: Stretches   Quad Stretch  Right;Left;2 reps;30 seconds    Quad Stretch Limitations  prone with rope    Other Lumbar Stretch Exercise  prone windshield wipers 10x5" holds for lumbar and hip ROM      Lumbar Exercises: Standing   Functional Squats  10 reps    Functional Squats Limitations  chair behind for form  Lumbar Exercises: Seated   Other Seated Lumbar Exercises  3D thoracic excursions x10 reps each      Lumbar Exercises: Supine   Bridge  10 reps    Straight Leg Raise  10 reps      Lumbar Exercises: Sidelying   Hip Abduction  Both;15 reps           PT Short Term Goals - 09/01/17 1204      PT SHORT TERM GOAL #1   Title  Pt will be independent with HEP and perfom consistently in order to decrease pain and maximzie return to PLOF.     Time  1    Period  Weeks    Status  Achieved        PT Long Term Goals - 09/01/17 1204      PT LONG TERM GOAL #1   Title  Pt will report decreased pain overall to minimal and report being able to ambulate stairs with greater ease to demo improved overall function.    Time  4    Period  Weeks    Status  On-going            Plan - 09/01/17 1204    Clinical Impression Statement  Pt reporting that the HEP is helping her lower back but that her R hip has started to hurt a little more. Per POC, PT updated pt's HEP this date as she would like to go on HEP for remainder of POC.  PT provided pt with continued exercises for mobility while adding in core, hip, and functional strengthening. Min cues required for form and educated pt to perform mobility work every day and strengthening at least every other day and she verbalized understanding. Pt will be placed on HEP for the remainder of her POC; she was educated to keep her last scheduled appointment in case she needed to f/u with regards to her HEP but was educated that she could cancel it if she felt like she didn't need it and she verbalized understanding.    Rehab Potential  Fair    PT Frequency  1x / week    PT Duration  4 weeks    PT Treatment/Interventions  ADLs/Self Care Home Management;Aquatic Therapy;Cryotherapy;Electrical Stimulation;Moist Heat;Ultrasound;DME Instruction;Stair training;Gait training;Functional mobility training;Therapeutic activities;Therapeutic exercise;Balance training;Neuromuscular re-education;Patient/family education;Manual techniques;Passive range of motion;Taping    PT Next Visit Plan  assess how pt did with updated HEP, make modifications as needed; discharge if pt wishes    PT Home Exercise Plan  eval: supine HS stretching (hands behind thigh), SKTC, DKTC, piriformis stretch, seated lumbar flexion stretch, LTRs; 7/22: see extensive additions below    Consulted and Agree with Plan of Care  Patient       Patient will benefit from skilled therapeutic intervention in order to improve the following deficits and impairments:  Decreased activity tolerance, Decreased balance, Decreased range of motion, Decreased strength, Difficulty walking, Hypomobility, Increased fascial restricitons, Increased muscle spasms, Impaired flexibility, Improper body mechanics, Pain  Visit Diagnosis: Acute bilateral low back pain without sciatica  Pain in right hip  Muscle weakness (generalized)  Difficulty in walking, not elsewhere classified     Problem List Patient Active Problem List   Diagnosis Date  Noted  . CVA (cerebral vascular accident) ([place]) 06/05/2016  . Hypertension 06/05/2016       Geraldine Solar PT, DPT  [facility] [address] [place], [place], [address] Phone: [phone]   Fax:  [phone]  Name: Kimberly Woods MRN: [id number] Date of Birth: [birth year]

## 2017-09-01 NOTE — Telephone Encounter (Signed)
Pt will be out of town

## 2017-09-08 ENCOUNTER — Encounter (HOSPITAL_COMMUNITY): Payer: Medicare Other

## 2017-09-15 ENCOUNTER — Encounter (HOSPITAL_COMMUNITY): Payer: Medicare Other

## 2017-09-19 ENCOUNTER — Telehealth (HOSPITAL_COMMUNITY): Payer: Self-pay | Admitting: Internal Medicine

## 2017-09-19 NOTE — Telephone Encounter (Signed)
09/19/17  pt called and said she wanted to just wait on therapy

## 2017-09-22 ENCOUNTER — Encounter (HOSPITAL_COMMUNITY): Payer: Medicare Other

## 2017-10-05 ENCOUNTER — Encounter (HOSPITAL_COMMUNITY): Payer: Self-pay | Admitting: Emergency Medicine

## 2017-10-05 ENCOUNTER — Emergency Department (HOSPITAL_COMMUNITY)
Admission: EM | Admit: 2017-10-05 | Discharge: 2017-10-05 | Disposition: A | Discharge status: Home / Self Care | Payer: Medicare Other | Attending: Emergency Medicine | Admitting: Emergency Medicine

## 2017-10-05 ENCOUNTER — Other Ambulatory Visit: Payer: Self-pay

## 2017-10-05 ENCOUNTER — Emergency Department (HOSPITAL_COMMUNITY): Payer: Medicare Other

## 2017-10-05 DIAGNOSIS — E7800 Pure hypercholesterolemia, unspecified: Secondary | ICD-10-CM | POA: Insufficient documentation

## 2017-10-05 DIAGNOSIS — W458XXA Other foreign body or object entering through skin, initial encounter: Secondary | ICD-10-CM | POA: Insufficient documentation

## 2017-10-05 DIAGNOSIS — Z79899 Other long term (current) drug therapy: Secondary | ICD-10-CM | POA: Insufficient documentation

## 2017-10-05 DIAGNOSIS — Y998 Other external cause status: Secondary | ICD-10-CM | POA: Diagnosis not present

## 2017-10-05 DIAGNOSIS — Y9389 Activity, other specified: Secondary | ICD-10-CM | POA: Insufficient documentation

## 2017-10-05 DIAGNOSIS — Z8673 Personal history of transient ischemic attack (TIA), and cerebral infarction without residual deficits: Secondary | ICD-10-CM | POA: Insufficient documentation

## 2017-10-05 DIAGNOSIS — Z7982 Long term (current) use of aspirin: Secondary | ICD-10-CM | POA: Insufficient documentation

## 2017-10-05 DIAGNOSIS — S61541A Puncture wound with foreign body of right wrist, initial encounter: Secondary | ICD-10-CM | POA: Insufficient documentation

## 2017-10-05 DIAGNOSIS — Z23 Encounter for immunization: Secondary | ICD-10-CM | POA: Insufficient documentation

## 2017-10-05 DIAGNOSIS — I10 Essential (primary) hypertension: Secondary | ICD-10-CM | POA: Insufficient documentation

## 2017-10-05 DIAGNOSIS — Y929 Unspecified place or not applicable: Secondary | ICD-10-CM | POA: Diagnosis not present

## 2017-10-05 DIAGNOSIS — T148XXA Other injury of unspecified body region, initial encounter: Secondary | ICD-10-CM

## 2017-10-05 MED ORDER — ONDANSETRON HCL 4 MG PO TABS
4.0000 mg | ORAL_TABLET | Freq: Once | ORAL | Status: AC
  Administered 2017-10-05: 4 mg via ORAL
  Filled 2017-10-05: qty 1

## 2017-10-05 MED ORDER — TETANUS-DIPHTH-ACELL PERTUSSIS 5-2.5-18.5 LF-MCG/0.5 IM SUSP
0.5000 mL | Freq: Once | INTRAMUSCULAR | Status: AC
  Administered 2017-10-05: 0.5 mL via INTRAMUSCULAR
  Filled 2017-10-05: qty 0.5

## 2017-10-05 MED ORDER — LIDOCAINE HCL (PF) 1 % IJ SOLN
5.0000 mL | Freq: Once | INTRAMUSCULAR | Status: AC
  Administered 2017-10-05: 5 mL
  Filled 2017-10-05: qty 6

## 2017-10-05 MED ORDER — POVIDONE-IODINE 10 % EX SOLN
CUTANEOUS | Status: AC
  Administered 2017-10-05: 22:00:00
  Filled 2017-10-05: qty 15

## 2017-10-05 MED ORDER — CEPHALEXIN 500 MG PO CAPS: 500.0000 mg | ORAL_CAPSULE | Freq: Four times a day (QID) | ORAL | 0 refills | Status: DC

## 2017-10-05 MED ORDER — CEPHALEXIN 500 MG PO CAPS
500.0000 mg | ORAL_CAPSULE | Freq: Once | ORAL | Status: AC
  Administered 2017-10-05: 500 mg via ORAL
  Filled 2017-10-05: qty 1

## 2017-10-05 NOTE — ED Provider Notes (Signed)
[facility] EMERGENCY DEPARTMENT Provider Note   CSN: [id number] Arrival date & time: 10/05/17  1652     History   Chief Complaint Chief Complaint  Patient presents with  . Foreign Body in Skin    HPI Kimberly Woods is a 77 y.o. female.  Patient is a 77 year old female who presents to the emergency department with a foreign body in the right wrist.  The patient was prepping a piece of furniture, when a large splinter went into her right wrist.  The patient was able to control the bleeding.  She is unsure of the date of her last tetanus.  She denies being on any anticoagulation medications.  And is not had any recent operations or procedures involving the right extremity.  She presents now for assistance with this issue.  The history is provided by the patient.    Past Medical History:  Diagnosis Date  . Allergy   . Blood transfusion without reported diagnosis    after childbirth  . Hypercholesterolemia 2018  . Hypertension   . Osteoporosis   . Stroke [facility]) 05/2016    Patient Active Problem List   Diagnosis Date Noted  . CVA (cerebral vascular accident) ([place]) 06/05/2016  . Hypertension 06/05/2016    Past Surgical History:  Procedure Laterality Date  . APPENDECTOMY  1963  . TONSILLECTOMY  1949  . TUBAL LIGATION  1974  . VAGINAL HYSTERECTOMY    . VITRECTOMY AND CATARACT Right 12/13/2016   Procedure: ANTERIOR VITRECTOMY AND CATARACT EXTRACTION RIGHT EYE CDE=18.58;  Surgeon: Baruch Goldmann, MD;  Location: [facility];  Service: Ophthalmology;  Laterality: Right;     OB History   None      Home Medications    Prior to Admission medications   Medication Sig Start Date End Date Taking? Authorizing Provider  acetaminophen (TYLENOL) 500 MG tablet Take 500 mg by mouth daily as needed for moderate pain or headache.    [provider]  aspirin 325 MG tablet Take 1 tablet (325 mg total) by mouth daily. 06/07/16   Regalado, Belkys A, MD  atorvastatin (LIPITOR)  40 MG tablet Take 1 tablet (40 mg total) by mouth daily at 6 PM. 06/07/16   Regalado, Belkys A, MD  Calcium-Magnesium-Zinc (CAL-MAG-ZINC PO) Take 2 tablets by mouth 2 (two) times daily.    [provider]  cetirizine (ZYRTEC) 10 MG tablet Take 10 mg by mouth daily as needed for allergies or rhinitis.    [provider]  cholecalciferol (VITAMIN D) 1000 units tablet Take 1,000 Units by mouth 2 (two) times daily.     [provider]  Garlic 6222 MG CAPS Take 1,000 mg by mouth daily.    [provider]  losartan-hydrochlorothiazide (HYZAAR) 100-25 MG tablet Take 1 tablet by mouth daily.    [provider]  Multiple Vitamin (MULTIVITAMIN WITH MINERALS) TABS tablet Take 1 tablet by mouth daily.    [provider]  Omega 3-6-9 Fatty Acids (OMEGA-3-6-9 PO) Take 1 capsule by mouth 2 (two) times daily.     [provider]  vitamin B-12 (CYANOCOBALAMIN) 1000 MCG tablet Take 1,000 mcg by mouth every evening.    [provider]  vitamin C (ASCORBIC ACID) 500 MG tablet Take 1,000 mg by mouth 2 (two) times daily.     [provider]    Family History Family History  Problem Relation Age of Onset  . Colon cancer Neg Hx     Social History Social History  Tobacco Use  . Smoking status: Never Smoker  . Smokeless tobacco: Never Used  Substance Use Topics  . Alcohol use: No  . Drug use: No     Allergies   Patient has no known allergies.   Review of Systems Review of Systems  Constitutional: Negative for activity change.       All ROS Neg except as noted in HPI  HENT: Negative for nosebleeds.   Eyes: Negative for photophobia and discharge.  Respiratory: Negative for cough, shortness of breath and wheezing.   Cardiovascular: Negative for chest pain and palpitations.  Gastrointestinal: Negative for abdominal pain and blood in stool.  Genitourinary: Negative for dysuria, frequency and hematuria.  Musculoskeletal:  Negative for arthralgias, back pain and neck pain.  Skin: Negative.   Neurological: Negative for dizziness, seizures and speech difficulty.  Psychiatric/Behavioral: Negative for confusion and hallucinations.     Physical Exam Updated Vital Signs BP (!) 148/94 (BP Location: Right Arm)   Pulse 65   Temp (!) 97.5 F (36.4 C) (Oral)   Resp 16   Ht 5\' 2"  (1.575 m)   Wt 61.2 kg   SpO2 100%   BMI 24.69 kg/m   Physical Exam  Constitutional: She is oriented to person, place, and time. She appears well-developed and well-nourished.  Non-toxic appearance.  HENT:  Head: Normocephalic.  Right Ear: Tympanic membrane and external ear normal.  Left Ear: Tympanic membrane and external ear normal.  Eyes: Pupils are equal, round, and reactive to light. EOM and lids are normal.  Neck: Normal range of motion. Neck supple. Carotid bruit is not present.  Cardiovascular: Normal rate, regular rhythm, normal heart sounds, intact distal pulses and normal pulses.  Pulmonary/Chest: Breath sounds normal. No respiratory distress.  Abdominal: Soft. Bowel sounds are normal. There is no tenderness. There is no guarding.  Musculoskeletal: Normal range of motion.       Right forearm: She exhibits tenderness.       Arms: Lymphadenopathy:       Head (right side): No submandibular adenopathy present.       Head (left side): No submandibular adenopathy present.    She has no cervical adenopathy.  Neurological: She is alert and oriented to person, place, and time. She has normal strength. No cranial nerve deficit or sensory deficit.  Skin: Skin is warm and dry.  Psychiatric: She has a normal mood and affect. Her speech is normal.  Nursing note and vitals reviewed.    ED Treatments / Results  Labs (all labs ordered are listed, but only abnormal results are displayed) Labs Reviewed - No data to display  EKG None  Radiology Dg Wrist Complete Right  Result Date: 10/05/2017 CLINICAL DATA:  Right wrist  injury EXAM: RIGHT WRIST - COMPLETE 3+ VIEW COMPARISON:  None. FINDINGS: No fracture or dislocation is seen. The joint spaces are preserved. Mild focal soft tissue swelling along the ventral wrist. No radiopaque foreign body is seen. IMPRESSION: Mild soft tissue swelling. No fracture, dislocation, or radiopaque foreign body is seen. Electronically Signed   By: Julian Hy M.D.   On: 10/05/2017 17:28    Procedures .Foreign Body Removal Date/Time: 10/05/2017 8:57 PM Performed by: Lily Kocher, PA-C Authorized by: Lily Kocher, PA-C  Consent: Verbal consent obtained. Risks and benefits: risks, benefits and alternatives were discussed Consent given by: patient Patient understanding: patient states understanding of the procedure being performed Imaging studies: imaging studies available Patient identity confirmed: arm band Time out: Immediately prior to procedure a "  time out" was called to verify the correct patient, procedure, equipment, support staff and site/side marked as required. Body area: skin General location: upper extremity Location details: right wrist Anesthesia: local infiltration  Anesthesia: Local Anesthetic: lidocaine 1% without epinephrine  Sedation: Patient sedated: no  Patient restrained: no Localization method: probed Removal mechanism: scalpel and forceps Dressing: dressing applied Tendon involvement: none Depth: subcutaneous Complexity: simple 1 objects recovered. Objects recovered: wooden splinter Post-procedure assessment: foreign body removed Patient tolerance: Patient tolerated the procedure well with no immediate complications Comments: Wound closed with one 5-0 Vicryl Rapid suture.   (including critical care time)  Medications Ordered in ED Medications  lidocaine (PF) (XYLOCAINE) 1 % injection 5 mL (has no administration in time range)     Initial Impression / Assessment and Plan / ED Course  I have reviewed the triage vital signs and  the nursing notes.  Pertinent labs & imaging results that were available during my care of the patient were reviewed by me and considered in my medical decision making (see chart for details).      Final Clinical Impressions(s) / ED Diagnoses MDM Vital signs within normal limits.  Pulse oximetry is 100% on room air.  Within normal limits by my interpretation.  The patient is noted to have a palpated foreign body of the right wrist. Foreign body removed without problem.  The wound was irrigated.  Patient will be started on antibiotic therapy.  Medication for pain given patient.  The patient is to return if any signs of infection, problems, or concerns.  Patient is in agreement with this plan.    Final diagnoses:  Foreign body in skin    ED Discharge Orders    None       Lily Kocher, PA-C 10/07/17 0144    Fredia Sorrow, MD 10/12/17 [phone]

## 2017-10-05 NOTE — ED Triage Notes (Signed)
Pt was wiping down a piece of wood when a large splinter went into her R wrist. Unknown tetanus shot. No bleeding in triage.

## 2017-10-05 NOTE — ED Notes (Signed)
Supplies in room for EDP

## 2017-10-05 NOTE — Discharge Instructions (Addendum)
Please change the dressing to the wound site daily with Neosporin.  Please use Keflex with breakfast, lunch, dinner, and at bedtime.  Please see Dr. Nevada Crane or return to the emergency department if any signs of infection.  The stitch that was placed in your wrist will dissolve on its own.

## 2017-10-23 ENCOUNTER — Encounter (HOSPITAL_COMMUNITY): Payer: Self-pay

## 2017-10-23 NOTE — Therapy (Signed)
[facility] [place] [place], [place], [address] Phone: [phone]   Fax:  [phone]  Patient Details  Name: Kimberly Woods MRN: [id number] Date of Birth: 1940/05/24 Referring Provider:  No ref. provider found  Encounter Date: 10/23/2017  PHYSICAL THERAPY DISCHARGE SUMMARY  Visits from Start of Care: 2  Current functional level related to goals / functional outcomes: See last treatment note   Remaining deficits: See last treatment note   Education / Equipment: n/a  Plan: Patient agrees to discharge.  Patient goals were not met. Patient is being discharged due to not returning since the last visit.  ?????      Geraldine Solar PT, [place] [address] [place], [place], [address] Phone: [phone]   Fax:  [phone]

## 2018-04-11 ENCOUNTER — Encounter: Payer: Self-pay | Admitting: Gastroenterology

## 2018-08-03 ENCOUNTER — Other Ambulatory Visit: Payer: Self-pay | Admitting: Ophthalmology

## 2018-08-11 ENCOUNTER — Other Ambulatory Visit: Payer: Self-pay | Admitting: Ophthalmology

## 2019-03-11 DIAGNOSIS — Z961 Presence of intraocular lens: Secondary | ICD-10-CM | POA: Diagnosis not present

## 2019-03-11 DIAGNOSIS — H2512 Age-related nuclear cataract, left eye: Secondary | ICD-10-CM | POA: Diagnosis not present

## 2019-04-10 ENCOUNTER — Encounter (HOSPITAL_COMMUNITY): Payer: Self-pay

## 2019-04-10 ENCOUNTER — Ambulatory Visit (HOSPITAL_COMMUNITY)
Admission: RE | Admit: 2019-04-10 | Discharge: 2019-04-10 | Disposition: A | Discharge status: Home / Self Care | Payer: Medicare PPO | Source: Ambulatory Visit | Attending: Internal Medicine | Admitting: Internal Medicine

## 2019-04-10 ENCOUNTER — Other Ambulatory Visit (HOSPITAL_COMMUNITY): Payer: Self-pay | Admitting: Internal Medicine

## 2019-04-10 ENCOUNTER — Other Ambulatory Visit: Payer: Self-pay

## 2019-04-10 DIAGNOSIS — M25562 Pain in left knee: Secondary | ICD-10-CM | POA: Insufficient documentation

## 2019-04-10 DIAGNOSIS — M25462 Effusion, left knee: Secondary | ICD-10-CM

## 2019-04-10 DIAGNOSIS — M7989 Other specified soft tissue disorders: Secondary | ICD-10-CM | POA: Diagnosis not present

## 2019-04-10 DIAGNOSIS — L03116 Cellulitis of left lower limb: Secondary | ICD-10-CM | POA: Diagnosis not present

## 2019-04-12 ENCOUNTER — Ambulatory Visit (HOSPITAL_COMMUNITY)
Admission: RE | Admit: 2019-04-12 | Discharge: 2019-04-12 | Disposition: A | Discharge status: Home / Self Care | Payer: Medicare PPO | Source: Ambulatory Visit | Attending: Internal Medicine | Admitting: Internal Medicine

## 2019-04-12 ENCOUNTER — Other Ambulatory Visit: Payer: Self-pay

## 2019-04-12 DIAGNOSIS — M25462 Effusion, left knee: Secondary | ICD-10-CM | POA: Diagnosis not present

## 2019-04-12 DIAGNOSIS — M7989 Other specified soft tissue disorders: Secondary | ICD-10-CM | POA: Diagnosis not present

## 2019-04-12 DIAGNOSIS — M79662 Pain in left lower leg: Secondary | ICD-10-CM | POA: Diagnosis not present

## 2019-04-12 DIAGNOSIS — M25562 Pain in left knee: Secondary | ICD-10-CM | POA: Insufficient documentation

## 2019-04-13 DIAGNOSIS — M5432 Sciatica, left side: Secondary | ICD-10-CM | POA: Diagnosis not present

## 2019-04-13 DIAGNOSIS — M25562 Pain in left knee: Secondary | ICD-10-CM | POA: Diagnosis not present

## 2019-04-29 DIAGNOSIS — J069 Acute upper respiratory infection, unspecified: Secondary | ICD-10-CM | POA: Diagnosis not present

## 2019-04-29 DIAGNOSIS — R05 Cough: Secondary | ICD-10-CM | POA: Diagnosis not present

## 2019-04-29 DIAGNOSIS — R110 Nausea: Secondary | ICD-10-CM | POA: Diagnosis not present

## 2019-05-05 DIAGNOSIS — S60940A Unspecified superficial injury of right index finger, initial encounter: Secondary | ICD-10-CM | POA: Diagnosis not present

## 2019-06-24 DIAGNOSIS — G4762 Sleep related leg cramps: Secondary | ICD-10-CM | POA: Diagnosis not present

## 2019-06-24 DIAGNOSIS — E785 Hyperlipidemia, unspecified: Secondary | ICD-10-CM | POA: Diagnosis not present

## 2019-06-24 DIAGNOSIS — G9009 Other idiopathic peripheral autonomic neuropathy: Secondary | ICD-10-CM | POA: Diagnosis not present

## 2019-06-24 DIAGNOSIS — H59029 Cataract (lens) fragments in eye following cataract surgery, unspecified eye: Secondary | ICD-10-CM | POA: Diagnosis not present

## 2019-06-24 DIAGNOSIS — Z0000 Encounter for general adult medical examination without abnormal findings: Secondary | ICD-10-CM | POA: Diagnosis not present

## 2019-06-24 DIAGNOSIS — H5710 Ocular pain, unspecified eye: Secondary | ICD-10-CM | POA: Diagnosis not present

## 2019-06-24 DIAGNOSIS — E782 Mixed hyperlipidemia: Secondary | ICD-10-CM | POA: Diagnosis not present

## 2019-06-24 DIAGNOSIS — H26109 Unspecified traumatic cataract, unspecified eye: Secondary | ICD-10-CM | POA: Diagnosis not present

## 2019-06-24 DIAGNOSIS — H59219 Accidental puncture and laceration of unspecified eye and adnexa during an ophthalmic procedure: Secondary | ICD-10-CM | POA: Diagnosis not present

## 2019-06-29 DIAGNOSIS — R05 Cough: Secondary | ICD-10-CM | POA: Diagnosis not present

## 2019-06-29 DIAGNOSIS — E782 Mixed hyperlipidemia: Secondary | ICD-10-CM | POA: Diagnosis not present

## 2019-06-29 DIAGNOSIS — Z0001 Encounter for general adult medical examination with abnormal findings: Secondary | ICD-10-CM | POA: Diagnosis not present

## 2019-06-29 DIAGNOSIS — I10 Essential (primary) hypertension: Secondary | ICD-10-CM | POA: Diagnosis not present

## 2019-06-29 DIAGNOSIS — R7301 Impaired fasting glucose: Secondary | ICD-10-CM | POA: Diagnosis not present

## 2019-06-29 DIAGNOSIS — G4762 Sleep related leg cramps: Secondary | ICD-10-CM | POA: Diagnosis not present

## 2019-06-29 DIAGNOSIS — I6529 Occlusion and stenosis of unspecified carotid artery: Secondary | ICD-10-CM | POA: Diagnosis not present

## 2019-06-29 DIAGNOSIS — Z8673 Personal history of transient ischemic attack (TIA), and cerebral infarction without residual deficits: Secondary | ICD-10-CM | POA: Diagnosis not present

## 2019-06-29 DIAGNOSIS — M545 Low back pain: Secondary | ICD-10-CM | POA: Diagnosis not present

## 2019-09-14 DIAGNOSIS — H2512 Age-related nuclear cataract, left eye: Secondary | ICD-10-CM | POA: Diagnosis not present

## 2019-09-23 DIAGNOSIS — M71341 Other bursal cyst, right hand: Secondary | ICD-10-CM | POA: Diagnosis not present

## 2019-09-23 DIAGNOSIS — I6529 Occlusion and stenosis of unspecified carotid artery: Secondary | ICD-10-CM | POA: Diagnosis not present

## 2019-09-23 DIAGNOSIS — R05 Cough: Secondary | ICD-10-CM | POA: Diagnosis not present

## 2019-09-23 DIAGNOSIS — M67441 Ganglion, right hand: Secondary | ICD-10-CM | POA: Diagnosis not present

## 2019-09-23 DIAGNOSIS — R7301 Impaired fasting glucose: Secondary | ICD-10-CM | POA: Diagnosis not present

## 2019-09-23 DIAGNOSIS — I10 Essential (primary) hypertension: Secondary | ICD-10-CM | POA: Diagnosis not present

## 2019-09-23 DIAGNOSIS — Z8673 Personal history of transient ischemic attack (TIA), and cerebral infarction without residual deficits: Secondary | ICD-10-CM | POA: Diagnosis not present

## 2019-09-23 DIAGNOSIS — E782 Mixed hyperlipidemia: Secondary | ICD-10-CM | POA: Diagnosis not present

## 2019-12-08 DIAGNOSIS — H2511 Age-related nuclear cataract, right eye: Secondary | ICD-10-CM | POA: Diagnosis not present

## 2019-12-08 DIAGNOSIS — H2512 Age-related nuclear cataract, left eye: Secondary | ICD-10-CM | POA: Diagnosis not present

## 2019-12-08 DIAGNOSIS — H25812 Combined forms of age-related cataract, left eye: Secondary | ICD-10-CM | POA: Diagnosis not present

## 2020-01-12 DIAGNOSIS — Z7409 Other reduced mobility: Secondary | ICD-10-CM | POA: Diagnosis not present

## 2020-01-12 DIAGNOSIS — R252 Cramp and spasm: Secondary | ICD-10-CM | POA: Diagnosis not present

## 2020-01-12 DIAGNOSIS — M71341 Other bursal cyst, right hand: Secondary | ICD-10-CM | POA: Diagnosis not present

## 2020-01-12 DIAGNOSIS — I10 Essential (primary) hypertension: Secondary | ICD-10-CM | POA: Diagnosis not present

## 2020-01-12 DIAGNOSIS — Z9181 History of falling: Secondary | ICD-10-CM | POA: Diagnosis not present

## 2020-01-12 DIAGNOSIS — S40812A Abrasion of left upper arm, initial encounter: Secondary | ICD-10-CM | POA: Diagnosis not present

## 2020-01-12 DIAGNOSIS — W19XXXA Unspecified fall, initial encounter: Secondary | ICD-10-CM | POA: Diagnosis not present

## 2020-01-12 DIAGNOSIS — Z0000 Encounter for general adult medical examination without abnormal findings: Secondary | ICD-10-CM | POA: Diagnosis not present

## 2020-01-12 DIAGNOSIS — E782 Mixed hyperlipidemia: Secondary | ICD-10-CM | POA: Diagnosis not present

## 2020-01-12 DIAGNOSIS — M67441 Ganglion, right hand: Secondary | ICD-10-CM | POA: Diagnosis not present

## 2020-01-12 DIAGNOSIS — Z0001 Encounter for general adult medical examination with abnormal findings: Secondary | ICD-10-CM | POA: Diagnosis not present

## 2020-01-12 DIAGNOSIS — R7301 Impaired fasting glucose: Secondary | ICD-10-CM | POA: Diagnosis not present

## 2020-01-12 DIAGNOSIS — H5710 Ocular pain, unspecified eye: Secondary | ICD-10-CM | POA: Diagnosis not present

## 2020-01-13 DIAGNOSIS — H35372 Puckering of macula, left eye: Secondary | ICD-10-CM | POA: Diagnosis not present

## 2020-01-19 DIAGNOSIS — I10 Essential (primary) hypertension: Secondary | ICD-10-CM | POA: Diagnosis not present

## 2020-01-19 DIAGNOSIS — M545 Low back pain, unspecified: Secondary | ICD-10-CM | POA: Diagnosis not present

## 2020-01-19 DIAGNOSIS — R059 Cough, unspecified: Secondary | ICD-10-CM | POA: Diagnosis not present

## 2020-01-19 DIAGNOSIS — Z8673 Personal history of transient ischemic attack (TIA), and cerebral infarction without residual deficits: Secondary | ICD-10-CM | POA: Diagnosis not present

## 2020-01-19 DIAGNOSIS — E782 Mixed hyperlipidemia: Secondary | ICD-10-CM | POA: Diagnosis not present

## 2020-01-19 DIAGNOSIS — R7301 Impaired fasting glucose: Secondary | ICD-10-CM | POA: Diagnosis not present

## 2020-01-19 DIAGNOSIS — Z23 Encounter for immunization: Secondary | ICD-10-CM | POA: Diagnosis not present

## 2020-01-19 DIAGNOSIS — I6529 Occlusion and stenosis of unspecified carotid artery: Secondary | ICD-10-CM | POA: Diagnosis not present

## 2020-01-19 DIAGNOSIS — G4762 Sleep related leg cramps: Secondary | ICD-10-CM | POA: Diagnosis not present

## 2020-01-24 DIAGNOSIS — H26492 Other secondary cataract, left eye: Secondary | ICD-10-CM | POA: Diagnosis not present

## 2020-02-17 DIAGNOSIS — R509 Fever, unspecified: Secondary | ICD-10-CM | POA: Diagnosis not present

## 2020-02-17 DIAGNOSIS — R0602 Shortness of breath: Secondary | ICD-10-CM | POA: Diagnosis not present

## 2020-02-17 DIAGNOSIS — R059 Cough, unspecified: Secondary | ICD-10-CM | POA: Diagnosis not present

## 2020-02-17 DIAGNOSIS — Z20822 Contact with and (suspected) exposure to covid-19: Secondary | ICD-10-CM | POA: Diagnosis not present

## 2020-02-17 DIAGNOSIS — R0981 Nasal congestion: Secondary | ICD-10-CM | POA: Diagnosis not present

## 2020-03-07 DIAGNOSIS — M545 Low back pain, unspecified: Secondary | ICD-10-CM | POA: Diagnosis not present

## 2020-03-07 DIAGNOSIS — U071 COVID-19: Secondary | ICD-10-CM | POA: Diagnosis not present

## 2020-03-07 DIAGNOSIS — N3000 Acute cystitis without hematuria: Secondary | ICD-10-CM | POA: Diagnosis not present

## 2020-03-07 DIAGNOSIS — R519 Headache, unspecified: Secondary | ICD-10-CM | POA: Diagnosis not present

## 2020-03-07 DIAGNOSIS — R059 Cough, unspecified: Secondary | ICD-10-CM | POA: Diagnosis not present

## 2020-03-07 DIAGNOSIS — R198 Other specified symptoms and signs involving the digestive system and abdomen: Secondary | ICD-10-CM | POA: Diagnosis not present

## 2020-03-07 DIAGNOSIS — J0100 Acute maxillary sinusitis, unspecified: Secondary | ICD-10-CM | POA: Diagnosis not present

## 2020-03-07 DIAGNOSIS — R5381 Other malaise: Secondary | ICD-10-CM | POA: Diagnosis not present

## 2020-04-13 DIAGNOSIS — B356 Tinea cruris: Secondary | ICD-10-CM | POA: Diagnosis not present

## 2020-05-01 DIAGNOSIS — Z124 Encounter for screening for malignant neoplasm of cervix: Secondary | ICD-10-CM | POA: Diagnosis not present

## 2020-05-01 DIAGNOSIS — Z6824 Body mass index (BMI) 24.0-24.9, adult: Secondary | ICD-10-CM | POA: Diagnosis not present

## 2020-05-17 DIAGNOSIS — Z6824 Body mass index (BMI) 24.0-24.9, adult: Secondary | ICD-10-CM | POA: Diagnosis not present

## 2020-05-17 DIAGNOSIS — E785 Hyperlipidemia, unspecified: Secondary | ICD-10-CM | POA: Diagnosis not present

## 2020-05-17 DIAGNOSIS — E782 Mixed hyperlipidemia: Secondary | ICD-10-CM | POA: Diagnosis not present

## 2020-05-17 DIAGNOSIS — R7301 Impaired fasting glucose: Secondary | ICD-10-CM | POA: Diagnosis not present

## 2020-05-17 DIAGNOSIS — I10 Essential (primary) hypertension: Secondary | ICD-10-CM | POA: Diagnosis not present

## 2020-05-24 DIAGNOSIS — I10 Essential (primary) hypertension: Secondary | ICD-10-CM | POA: Diagnosis not present

## 2020-05-24 DIAGNOSIS — G4762 Sleep related leg cramps: Secondary | ICD-10-CM | POA: Diagnosis not present

## 2020-05-24 DIAGNOSIS — E782 Mixed hyperlipidemia: Secondary | ICD-10-CM | POA: Diagnosis not present

## 2020-05-24 DIAGNOSIS — R7301 Impaired fasting glucose: Secondary | ICD-10-CM | POA: Diagnosis not present

## 2020-05-24 DIAGNOSIS — Z8673 Personal history of transient ischemic attack (TIA), and cerebral infarction without residual deficits: Secondary | ICD-10-CM | POA: Diagnosis not present

## 2020-05-24 DIAGNOSIS — I6529 Occlusion and stenosis of unspecified carotid artery: Secondary | ICD-10-CM | POA: Diagnosis not present

## 2020-11-24 DIAGNOSIS — R7301 Impaired fasting glucose: Secondary | ICD-10-CM | POA: Insufficient documentation

## 2020-11-24 DIAGNOSIS — I10 Essential (primary) hypertension: Secondary | ICD-10-CM | POA: Diagnosis not present

## 2020-11-28 DIAGNOSIS — R7303 Prediabetes: Secondary | ICD-10-CM | POA: Insufficient documentation

## 2020-11-29 DIAGNOSIS — R2689 Other abnormalities of gait and mobility: Secondary | ICD-10-CM | POA: Diagnosis not present

## 2020-11-29 DIAGNOSIS — L821 Other seborrheic keratosis: Secondary | ICD-10-CM | POA: Insufficient documentation

## 2020-11-29 DIAGNOSIS — I6529 Occlusion and stenosis of unspecified carotid artery: Secondary | ICD-10-CM | POA: Diagnosis not present

## 2020-11-29 DIAGNOSIS — Z0001 Encounter for general adult medical examination with abnormal findings: Secondary | ICD-10-CM | POA: Diagnosis not present

## 2020-11-29 DIAGNOSIS — I639 Cerebral infarction, unspecified: Secondary | ICD-10-CM | POA: Diagnosis not present

## 2020-11-29 DIAGNOSIS — I10 Essential (primary) hypertension: Secondary | ICD-10-CM | POA: Diagnosis not present

## 2020-11-29 DIAGNOSIS — R7303 Prediabetes: Secondary | ICD-10-CM | POA: Diagnosis not present

## 2020-11-29 DIAGNOSIS — E782 Mixed hyperlipidemia: Secondary | ICD-10-CM | POA: Diagnosis not present

## 2020-12-13 DIAGNOSIS — L728 Other follicular cysts of the skin and subcutaneous tissue: Secondary | ICD-10-CM | POA: Diagnosis not present

## 2020-12-13 DIAGNOSIS — L918 Other hypertrophic disorders of the skin: Secondary | ICD-10-CM | POA: Diagnosis not present

## 2020-12-13 DIAGNOSIS — L821 Other seborrheic keratosis: Secondary | ICD-10-CM | POA: Diagnosis not present

## 2020-12-16 DIAGNOSIS — M5412 Radiculopathy, cervical region: Secondary | ICD-10-CM | POA: Diagnosis not present

## 2020-12-16 DIAGNOSIS — M542 Cervicalgia: Secondary | ICD-10-CM | POA: Insufficient documentation

## 2021-03-08 DIAGNOSIS — M545 Low back pain, unspecified: Secondary | ICD-10-CM | POA: Insufficient documentation

## 2021-03-08 DIAGNOSIS — S76011A Strain of muscle, fascia and tendon of right hip, initial encounter: Secondary | ICD-10-CM | POA: Diagnosis not present

## 2021-05-24 DIAGNOSIS — R109 Unspecified abdominal pain: Secondary | ICD-10-CM | POA: Diagnosis not present

## 2021-05-24 DIAGNOSIS — N39498 Other specified urinary incontinence: Secondary | ICD-10-CM | POA: Diagnosis not present

## 2021-05-24 DIAGNOSIS — M545 Low back pain, unspecified: Secondary | ICD-10-CM | POA: Diagnosis not present

## 2021-05-28 DIAGNOSIS — I10 Essential (primary) hypertension: Secondary | ICD-10-CM | POA: Diagnosis not present

## 2021-05-28 DIAGNOSIS — R7303 Prediabetes: Secondary | ICD-10-CM | POA: Diagnosis not present

## 2021-05-28 DIAGNOSIS — N390 Urinary tract infection, site not specified: Secondary | ICD-10-CM | POA: Insufficient documentation

## 2021-05-30 DIAGNOSIS — N390 Urinary tract infection, site not specified: Secondary | ICD-10-CM | POA: Diagnosis not present

## 2021-05-30 DIAGNOSIS — R7303 Prediabetes: Secondary | ICD-10-CM | POA: Diagnosis not present

## 2021-05-30 DIAGNOSIS — R2689 Other abnormalities of gait and mobility: Secondary | ICD-10-CM | POA: Diagnosis not present

## 2021-05-30 DIAGNOSIS — L821 Other seborrheic keratosis: Secondary | ICD-10-CM | POA: Diagnosis not present

## 2021-05-30 DIAGNOSIS — E782 Mixed hyperlipidemia: Secondary | ICD-10-CM | POA: Diagnosis not present

## 2021-05-30 DIAGNOSIS — I10 Essential (primary) hypertension: Secondary | ICD-10-CM | POA: Diagnosis not present

## 2021-05-30 DIAGNOSIS — G8194 Hemiplegia, unspecified affecting left nondominant side: Secondary | ICD-10-CM | POA: Insufficient documentation

## 2021-05-30 DIAGNOSIS — Z8673 Personal history of transient ischemic attack (TIA), and cerebral infarction without residual deficits: Secondary | ICD-10-CM | POA: Diagnosis not present

## 2021-05-30 DIAGNOSIS — I6529 Occlusion and stenosis of unspecified carotid artery: Secondary | ICD-10-CM | POA: Diagnosis not present

## 2021-05-30 DIAGNOSIS — Z0001 Encounter for general adult medical examination with abnormal findings: Secondary | ICD-10-CM | POA: Diagnosis not present

## 2021-05-31 ENCOUNTER — Other Ambulatory Visit: Payer: Self-pay | Admitting: Internal Medicine

## 2021-05-31 ENCOUNTER — Other Ambulatory Visit (HOSPITAL_COMMUNITY): Payer: Self-pay | Admitting: Internal Medicine

## 2021-05-31 DIAGNOSIS — I6529 Occlusion and stenosis of unspecified carotid artery: Secondary | ICD-10-CM

## 2021-06-02 DIAGNOSIS — B029 Zoster without complications: Secondary | ICD-10-CM | POA: Insufficient documentation

## 2021-06-07 ENCOUNTER — Other Ambulatory Visit (HOSPITAL_COMMUNITY): Payer: Self-pay | Admitting: Family Medicine

## 2021-06-07 ENCOUNTER — Other Ambulatory Visit: Payer: Self-pay | Admitting: Family Medicine

## 2021-06-07 DIAGNOSIS — R109 Unspecified abdominal pain: Secondary | ICD-10-CM | POA: Diagnosis not present

## 2021-06-08 ENCOUNTER — Ambulatory Visit (HOSPITAL_BASED_OUTPATIENT_CLINIC_OR_DEPARTMENT_OTHER)
Admission: RE | Admit: 2021-06-08 | Discharge: 2021-06-08 | Disposition: A | Discharge status: Home / Self Care | Payer: Medicare PPO | Source: Ambulatory Visit | Attending: Family Medicine | Admitting: Family Medicine

## 2021-06-08 DIAGNOSIS — R109 Unspecified abdominal pain: Secondary | ICD-10-CM | POA: Insufficient documentation

## 2021-06-08 DIAGNOSIS — I700 Atherosclerosis of aorta: Secondary | ICD-10-CM | POA: Diagnosis not present

## 2021-06-08 LAB — POCT I-STAT CREATININE: Creatinine, Ser: 0.9 mg/dL (ref 0.44–1.00)

## 2021-06-08 MED ORDER — IOHEXOL 300 MG/ML  SOLN
100.0000 mL | Freq: Once | INTRAMUSCULAR | Status: AC | PRN
  Administered 2021-06-08: 80 mL via INTRAVENOUS

## 2021-06-11 ENCOUNTER — Ambulatory Visit (HOSPITAL_COMMUNITY)
Admission: RE | Admit: 2021-06-11 | Discharge: 2021-06-11 | Disposition: A | Discharge status: Home / Self Care | Payer: Medicare PPO | Source: Ambulatory Visit | Attending: Internal Medicine | Admitting: Internal Medicine

## 2021-06-11 DIAGNOSIS — Z8673 Personal history of transient ischemic attack (TIA), and cerebral infarction without residual deficits: Secondary | ICD-10-CM | POA: Diagnosis not present

## 2021-06-11 DIAGNOSIS — I6529 Occlusion and stenosis of unspecified carotid artery: Secondary | ICD-10-CM | POA: Diagnosis not present

## 2021-06-11 DIAGNOSIS — I10 Essential (primary) hypertension: Secondary | ICD-10-CM | POA: Diagnosis not present

## 2021-06-11 DIAGNOSIS — E785 Hyperlipidemia, unspecified: Secondary | ICD-10-CM | POA: Diagnosis not present

## 2021-06-11 DIAGNOSIS — I6523 Occlusion and stenosis of bilateral carotid arteries: Secondary | ICD-10-CM | POA: Diagnosis not present

## 2021-08-03 ENCOUNTER — Ambulatory Visit (HOSPITAL_COMMUNITY)
Admission: RE | Admit: 2021-08-03 | Discharge: 2021-08-03 | Disposition: A | Discharge status: Home / Self Care | Payer: Medicare PPO | Source: Ambulatory Visit | Attending: Internal Medicine | Admitting: Internal Medicine

## 2021-08-03 ENCOUNTER — Other Ambulatory Visit: Payer: Self-pay | Admitting: Internal Medicine

## 2021-08-03 ENCOUNTER — Other Ambulatory Visit (HOSPITAL_COMMUNITY): Payer: Self-pay | Admitting: Internal Medicine

## 2021-08-03 DIAGNOSIS — M79662 Pain in left lower leg: Secondary | ICD-10-CM | POA: Insufficient documentation

## 2021-08-03 DIAGNOSIS — M7989 Other specified soft tissue disorders: Secondary | ICD-10-CM

## 2021-08-03 DIAGNOSIS — M79661 Pain in right lower leg: Secondary | ICD-10-CM | POA: Diagnosis not present

## 2021-08-03 DIAGNOSIS — M79605 Pain in left leg: Secondary | ICD-10-CM | POA: Diagnosis not present

## 2021-08-03 DIAGNOSIS — R600 Localized edema: Secondary | ICD-10-CM | POA: Diagnosis not present

## 2021-08-03 DIAGNOSIS — M79604 Pain in right leg: Secondary | ICD-10-CM | POA: Diagnosis not present

## 2021-10-04 DIAGNOSIS — M25561 Pain in right knee: Secondary | ICD-10-CM | POA: Diagnosis not present

## 2021-10-04 DIAGNOSIS — M545 Low back pain, unspecified: Secondary | ICD-10-CM | POA: Diagnosis not present

## 2021-10-12 ENCOUNTER — Ambulatory Visit (INDEPENDENT_AMBULATORY_CARE_PROVIDER_SITE_OTHER): Payer: Medicare PPO

## 2021-10-12 ENCOUNTER — Ambulatory Visit: Payer: Medicare PPO | Admitting: Orthopedic Surgery

## 2021-10-12 ENCOUNTER — Encounter: Payer: Self-pay | Admitting: Orthopedic Surgery

## 2021-10-12 VITALS — BP 146/89 | HR 77 | Ht 62.0 in | Wt 145.6 lb

## 2021-10-12 DIAGNOSIS — M4317 Spondylolisthesis, lumbosacral region: Secondary | ICD-10-CM | POA: Diagnosis not present

## 2021-10-12 DIAGNOSIS — M79605 Pain in left leg: Secondary | ICD-10-CM

## 2021-10-12 MED ORDER — NORTRIPTYLINE HCL 10 MG PO CAPS: 10.0000 mg | ORAL_CAPSULE | Freq: Every day | ORAL | 0 refills | Status: DC

## 2021-10-12 MED ORDER — PREDNISONE 10 MG (48) PO TBPK: ORAL_TABLET | Freq: Every day | ORAL | 0 refills | Status: DC

## 2021-10-12 MED ORDER — TIZANIDINE HCL 4 MG PO TABS: 4.0000 mg | ORAL_TABLET | Freq: Four times a day (QID) | ORAL | 0 refills | Status: DC | PRN

## 2021-10-12 NOTE — Patient Instructions (Signed)
Stop celebrex for now   Change how you take tizanidine

## 2021-10-12 NOTE — Progress Notes (Signed)
Chief Complaint  Patient presents with   New Patient (Initial Visit)   Knee Pain    Severe pain in back of RT leg    HPI: 81 year old female comes in for evaluation of bilateral leg pain right worse than left severe.  Patient has been worked up with ultrasound ruled out for blood clot that was negative.  She was placed on some muscle relaxers for muscle cramps but that did not help.  She actually never complained of muscle spasms just pain behind her knees and legs.  She does report a history of lower back pain.  She had an x-ray of her knee in 2021 it showed no arthritis  She has no anterior pain at all.    Past Medical History:  Diagnosis Date   Allergy    Blood transfusion without reported diagnosis    after childbirth   Hypercholesterolemia 2018   Hypertension    Osteoporosis    Stroke ([place]) 05/2016    BP (!) 146/89   Pulse 77   Ht '5\' 2"'$  (1.575 m)   Wt 145 lb 9.6 oz (66 kg)   BMI 26.63 kg/m    General appearance: Well-developed well-nourished no gross deformities  Cardiovascular normal pulse and perfusion normal color without edema  Neurologically no sensation loss or deficits or pathologic reflexes  Psychological: Awake alert and oriented x3 mood and affect normal  Skin no lacerations or ulcerations no nodularity no palpable masses, no erythema or nodularity  Musculoskeletal: Tenderness is in the lateral thigh posterior thigh posterior part of the knee anterolateral leg both sides right worse than left also has right lower back and midline tenderness in the L4-5 region  Imaging I took an x-ray of her back 5 views of the lumbar spine  A/P  Imaging shows definite scoliosis and grade 1 if not 2 spondylolisthesis L4 on 5  Patient did not tolerate gabapentin for treatment of her shingles  Recommend the following  Meds ordered this encounter  Medications   tiZANidine (ZANAFLEX) 4 MG tablet    Sig: Take 1 tablet (4 mg total) by mouth every 6 (six) hours as  needed for muscle spasms.    Dispense:  30 tablet    Refill:  0   DISCONTD: predniSONE (STERAPRED UNI-PAK 48 TAB) 10 MG (48) TBPK tablet    Sig: Take by mouth daily.    Dispense:  48 tablet    Refill:  0   nortriptyline (PAMELOR) 10 MG capsule    Sig: Take 1 capsule (10 mg total) by mouth at bedtime.    Dispense:  30 capsule    Refill:  0   predniSONE (STERAPRED UNI-PAK 48 TAB) 10 MG (48) TBPK tablet    Sig: Take by mouth daily. 10 mg ds 12 days    Dispense:  48 tablet    Refill:  0

## 2021-11-07 ENCOUNTER — Telehealth: Payer: Self-pay | Admitting: Orthopedic Surgery

## 2021-11-07 ENCOUNTER — Other Ambulatory Visit: Payer: Self-pay | Admitting: Orthopedic Surgery

## 2021-11-07 DIAGNOSIS — M5430 Sciatica, unspecified side: Secondary | ICD-10-CM | POA: Insufficient documentation

## 2021-11-07 DIAGNOSIS — M79604 Pain in right leg: Secondary | ICD-10-CM

## 2021-11-07 DIAGNOSIS — I10 Essential (primary) hypertension: Secondary | ICD-10-CM | POA: Diagnosis not present

## 2021-11-07 MED ORDER — TIZANIDINE HCL 4 MG PO TABS: 4.0000 mg | ORAL_TABLET | Freq: Four times a day (QID) | ORAL | 0 refills | Status: DC | PRN

## 2021-11-07 NOTE — Telephone Encounter (Signed)
Stop celebrex for now was instructions on AVS

## 2021-11-07 NOTE — Telephone Encounter (Signed)
Call received from patient with mention of continued pain; states has taken all of the muscle relaxer medication and the prednisone. She is aware of appointment on Monday, 11/12/21. Patient also mentioned having refilled a medication from [facility] Dr Nevada Crane, celecoxib/Celebrex, which she is also concerned about, due to side effects (states she had called Dr Juel Burrow office for this question). Please call patient, as she has a few general questions.

## 2021-11-07 NOTE — Progress Notes (Signed)
Encounter Diagnosis  Name Primary?   Lumbar pain with radiation down both legs    Meds ordered this encounter  Medications   tiZANidine (ZANAFLEX) 4 MG tablet    Sig: Take 1 tablet (4 mg total) by mouth every 6 (six) hours as needed for muscle spasms.    Dispense:  30 tablet    Refill:  0

## 2021-11-07 NOTE — Telephone Encounter (Signed)
Advised her to discuss the celebrex with the pharmacist, she asked if its okay since she had a stroke previously and there is a warning about using it.

## 2021-11-07 NOTE — Telephone Encounter (Signed)
She states she didn't take the Celebrex.  She wants a refill on Tizanidine

## 2021-11-12 ENCOUNTER — Ambulatory Visit: Payer: Medicare PPO | Admitting: Orthopedic Surgery

## 2021-11-12 ENCOUNTER — Encounter: Payer: Self-pay | Admitting: Orthopedic Surgery

## 2021-11-12 VITALS — BP 112/74 | HR 94 | Ht 62.5 in | Wt 144.1 lb

## 2021-11-12 DIAGNOSIS — M545 Low back pain, unspecified: Secondary | ICD-10-CM | POA: Diagnosis not present

## 2021-11-12 DIAGNOSIS — M79604 Pain in right leg: Secondary | ICD-10-CM

## 2021-11-12 DIAGNOSIS — M4317 Spondylolisthesis, lumbosacral region: Secondary | ICD-10-CM | POA: Diagnosis not present

## 2021-11-12 DIAGNOSIS — M79605 Pain in left leg: Secondary | ICD-10-CM

## 2021-11-12 MED ORDER — CELECOXIB 200 MG PO CAPS: 200.0000 mg | ORAL_CAPSULE | Freq: Every day | ORAL | 5 refills | Status: DC

## 2021-11-12 MED ORDER — TIZANIDINE HCL 4 MG PO TABS: 4.0000 mg | ORAL_TABLET | Freq: Four times a day (QID) | ORAL | 0 refills | Status: DC | PRN

## 2021-11-12 MED ORDER — NORTRIPTYLINE HCL 10 MG PO CAPS: 10.0000 mg | ORAL_CAPSULE | Freq: Every day | ORAL | 0 refills | Status: DC

## 2021-11-12 NOTE — Progress Notes (Signed)
Chief Complaint  Patient presents with   Back Pain    Back is still bothering me, but not like it was.   81 year old female was seen for bilateral leg pain right worse than left.  She took some muscle relaxers for cramping but it did not help.  She did never complained of muscle spasms she has had pain behind her knees.  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  Prior imaging showed scoliosis and grade 1 if not 2 spondylolisthesis of L4 on 5  She does not tolerate gabapentin so we put her on tizanidine and prednisone as well as nortriptyline  She has made some improvement in terms of her pain she says is no longer severe.  She has finished the prednisone she is still taking the tizanidine nortriptyline and Celebrex  No complications are noted from the medications  We advised her to continue trying to increase her ambulation to 100 to 200 feet first and then try to get to her mailbox which is 200 feet away  Medication refills  Our goal is for her to have 2 out of 10 pain and to ambulate half of a mile  Meds ordered this encounter  Medications   nortriptyline (PAMELOR) 10 MG capsule    Sig: Take 1 capsule (10 mg total) by mouth at bedtime.    Dispense:  30 capsule    Refill:  0   tiZANidine (ZANAFLEX) 4 MG tablet    Sig: Take 1 tablet (4 mg total) by mouth every 6 (six) hours as needed for muscle spasms.    Dispense:  30 tablet    Refill:  0   celecoxib (CELEBREX) 200 MG capsule    Sig: Take 1 capsule (200 mg total) by mouth daily.    Dispense:  60 capsule    Refill:  5

## 2021-11-22 DIAGNOSIS — M5412 Radiculopathy, cervical region: Secondary | ICD-10-CM | POA: Diagnosis not present

## 2021-11-22 DIAGNOSIS — E559 Vitamin D deficiency, unspecified: Secondary | ICD-10-CM | POA: Diagnosis not present

## 2021-11-22 DIAGNOSIS — R7303 Prediabetes: Secondary | ICD-10-CM | POA: Diagnosis not present

## 2021-11-22 DIAGNOSIS — E782 Mixed hyperlipidemia: Secondary | ICD-10-CM | POA: Diagnosis not present

## 2021-11-26 ENCOUNTER — Other Ambulatory Visit: Payer: Self-pay | Admitting: Orthopedic Surgery

## 2021-11-26 DIAGNOSIS — M545 Low back pain, unspecified: Secondary | ICD-10-CM

## 2021-11-29 DIAGNOSIS — R7303 Prediabetes: Secondary | ICD-10-CM | POA: Diagnosis not present

## 2021-11-29 DIAGNOSIS — R2689 Other abnormalities of gait and mobility: Secondary | ICD-10-CM | POA: Diagnosis not present

## 2021-11-29 DIAGNOSIS — I6529 Occlusion and stenosis of unspecified carotid artery: Secondary | ICD-10-CM | POA: Diagnosis not present

## 2021-11-29 DIAGNOSIS — E876 Hypokalemia: Secondary | ICD-10-CM | POA: Insufficient documentation

## 2021-11-29 DIAGNOSIS — I10 Essential (primary) hypertension: Secondary | ICD-10-CM | POA: Diagnosis not present

## 2021-11-29 DIAGNOSIS — M4316 Spondylolisthesis, lumbar region: Secondary | ICD-10-CM | POA: Insufficient documentation

## 2021-11-29 DIAGNOSIS — Z0000 Encounter for general adult medical examination without abnormal findings: Secondary | ICD-10-CM | POA: Diagnosis not present

## 2021-11-29 DIAGNOSIS — Z8673 Personal history of transient ischemic attack (TIA), and cerebral infarction without residual deficits: Secondary | ICD-10-CM | POA: Diagnosis not present

## 2021-11-29 DIAGNOSIS — L821 Other seborrheic keratosis: Secondary | ICD-10-CM | POA: Diagnosis not present

## 2021-11-29 DIAGNOSIS — G8194 Hemiplegia, unspecified affecting left nondominant side: Secondary | ICD-10-CM | POA: Diagnosis not present

## 2021-11-29 DIAGNOSIS — E782 Mixed hyperlipidemia: Secondary | ICD-10-CM | POA: Diagnosis not present

## 2021-12-19 ENCOUNTER — Other Ambulatory Visit: Payer: Self-pay | Admitting: Orthopedic Surgery

## 2021-12-19 DIAGNOSIS — M7989 Other specified soft tissue disorders: Secondary | ICD-10-CM | POA: Diagnosis not present

## 2021-12-19 DIAGNOSIS — I10 Essential (primary) hypertension: Secondary | ICD-10-CM | POA: Diagnosis not present

## 2021-12-19 DIAGNOSIS — T161XXA Foreign body in right ear, initial encounter: Secondary | ICD-10-CM | POA: Diagnosis not present

## 2021-12-19 DIAGNOSIS — E876 Hypokalemia: Secondary | ICD-10-CM | POA: Diagnosis not present

## 2021-12-19 DIAGNOSIS — M79604 Pain in right leg: Secondary | ICD-10-CM

## 2022-02-06 ENCOUNTER — Other Ambulatory Visit: Payer: Self-pay | Admitting: Orthopedic Surgery

## 2022-02-06 DIAGNOSIS — M545 Low back pain, unspecified: Secondary | ICD-10-CM

## 2022-03-18 ENCOUNTER — Ambulatory Visit: Payer: Medicare PPO | Admitting: Orthopedic Surgery

## 2022-03-18 ENCOUNTER — Encounter: Payer: Self-pay | Admitting: Orthopedic Surgery

## 2022-03-18 DIAGNOSIS — M79605 Pain in left leg: Secondary | ICD-10-CM | POA: Diagnosis not present

## 2022-03-18 DIAGNOSIS — M79604 Pain in right leg: Secondary | ICD-10-CM | POA: Diagnosis not present

## 2022-03-18 DIAGNOSIS — M4317 Spondylolisthesis, lumbosacral region: Secondary | ICD-10-CM | POA: Diagnosis not present

## 2022-03-18 DIAGNOSIS — M545 Low back pain, unspecified: Secondary | ICD-10-CM

## 2022-03-18 MED ORDER — TIZANIDINE HCL 4 MG PO TABS: ORAL_TABLET | ORAL | 5 refills | Status: DC

## 2022-03-18 MED ORDER — NORTRIPTYLINE HCL 10 MG PO CAPS: ORAL_CAPSULE | ORAL | 5 refills | Status: AC

## 2022-03-18 MED ORDER — CELECOXIB 200 MG PO CAPS: 200.0000 mg | ORAL_CAPSULE | Freq: Every day | ORAL | 5 refills | Status: AC

## 2022-03-18 NOTE — Progress Notes (Signed)
Chief Complaint  Patient presents with   Back Pain    82 year old female currently with degenerative disc disease and some radicular symptoms consistent with spinal stenosis  Prior history  82 year old female was seen for bilateral leg pain right worse than left.  She took some muscle relaxers for cramping but it did not help.  She did never complained of muscle spasms she has had pain behind her knees.  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   Prior imaging showed scoliosis and grade 1 if not 2 spondylolisthesis of L4 on 5   She does not tolerate gabapentin so we put her on tizanidine and prednisone as well as nortriptyline   She has made some improvement in terms of her pain she says is no longer severe.  She has finished the prednisone she is still taking the tizanidine nortriptyline and Celebrex   No complications are noted from the medications   We advised her to continue trying to increase her ambulation to 100 to 200 feet first and then try to get to her mailbox which is 200 feet away   Today  She is doing well much improved in terms of overall pain she still has a little bit of discomfort and some calf soreness  She is complaining of some balance issues and she is considering therapy to address that after her husband surgery has been completed  She is to continue the medication as ordered  Meds ordered this encounter  Medications   celecoxib (CELEBREX) 200 MG capsule    Sig: Take 1 capsule (200 mg total) by mouth daily.    Dispense:  60 capsule    Refill:  5   nortriptyline (PAMELOR) 10 MG capsule    Sig: TAKE 1 CAPSULE(10 MG) BY MOUTH AT BEDTIME    Dispense:  30 capsule    Refill:  5   tiZANidine (ZANAFLEX) 4 MG tablet    Sig: TAKE 1 TABLET(4 MG) BY MOUTH EVERY 6 HOURS AS NEEDED FOR MUSCLE SPASMS    Dispense:  30 tablet    Refill:  5   Follow-up in 6 months

## 2022-04-11 ENCOUNTER — Encounter: Payer: Self-pay | Admitting: Radiology

## 2022-05-24 DIAGNOSIS — E782 Mixed hyperlipidemia: Secondary | ICD-10-CM | POA: Diagnosis not present

## 2022-05-24 DIAGNOSIS — Z0000 Encounter for general adult medical examination without abnormal findings: Secondary | ICD-10-CM | POA: Diagnosis not present

## 2022-05-24 DIAGNOSIS — R7303 Prediabetes: Secondary | ICD-10-CM | POA: Diagnosis not present

## 2022-05-27 DIAGNOSIS — M4316 Spondylolisthesis, lumbar region: Secondary | ICD-10-CM | POA: Diagnosis not present

## 2022-05-27 DIAGNOSIS — R7303 Prediabetes: Secondary | ICD-10-CM | POA: Diagnosis not present

## 2022-05-27 DIAGNOSIS — E876 Hypokalemia: Secondary | ICD-10-CM | POA: Diagnosis not present

## 2022-05-27 DIAGNOSIS — I10 Essential (primary) hypertension: Secondary | ICD-10-CM | POA: Diagnosis not present

## 2022-05-27 DIAGNOSIS — M7989 Other specified soft tissue disorders: Secondary | ICD-10-CM | POA: Diagnosis not present

## 2022-05-27 DIAGNOSIS — E782 Mixed hyperlipidemia: Secondary | ICD-10-CM | POA: Diagnosis not present

## 2022-05-27 DIAGNOSIS — M545 Low back pain, unspecified: Secondary | ICD-10-CM | POA: Diagnosis not present

## 2022-05-27 DIAGNOSIS — I6529 Occlusion and stenosis of unspecified carotid artery: Secondary | ICD-10-CM | POA: Diagnosis not present

## 2022-05-30 ENCOUNTER — Telehealth: Payer: Self-pay | Admitting: Orthopaedic Surgery

## 2022-06-21 ENCOUNTER — Other Ambulatory Visit: Payer: Self-pay | Admitting: Orthopedic Surgery

## 2022-06-21 DIAGNOSIS — M79604 Pain in right leg: Secondary | ICD-10-CM

## 2022-08-26 ENCOUNTER — Encounter: Payer: Self-pay | Admitting: Orthopedic Surgery

## 2022-08-26 ENCOUNTER — Ambulatory Visit: Payer: Medicare PPO | Admitting: Orthopedic Surgery

## 2022-08-26 VITALS — BP 107/71 | HR 76 | Ht 61.0 in | Wt 141.0 lb

## 2022-08-26 DIAGNOSIS — M4317 Spondylolisthesis, lumbosacral region: Secondary | ICD-10-CM | POA: Diagnosis not present

## 2022-08-26 NOTE — Progress Notes (Signed)
Chief Complaint  Patient presents with   Back Pain    Patient having back pain over 1 year lower middle back that radiated thru the butt and to the feet tingle in the am abd the pain more on the left than the right     Kimberly Woods is having some more difficulty with her lower back.  Her husband has had revision hip surgery x 2 over the last 6 months and she is having to care for him  She gets pain when she leans over to help and get his shoes on  She is currently taking Celebrex on a limited basis as well as her tizanidine.  The nortriptyline she does not take very often  At this point I think it is reasonable to either consider MRI and epidural injection or physical therapy  She has opted for physical therapy  She will resume her Celebrex continue her tizanidine to nortriptyline as needed  Follow-up with me in 6 months FU 6 months   No orders of the defined types were placed in this encounter.

## 2022-08-26 NOTE — Patient Instructions (Signed)
Physical therapy has been ordered for you at [facility]. They should call you to schedule, [phone] is the phone number to call, if you want to call to schedule.   

## 2022-08-31 DIAGNOSIS — T161XXA Foreign body in right ear, initial encounter: Secondary | ICD-10-CM | POA: Diagnosis not present

## 2022-09-02 DIAGNOSIS — T161XXA Foreign body in right ear, initial encounter: Secondary | ICD-10-CM | POA: Diagnosis not present

## 2022-09-16 ENCOUNTER — Ambulatory Visit: Payer: Medicare PPO | Admitting: Orthopedic Surgery

## 2022-09-18 DIAGNOSIS — E782 Mixed hyperlipidemia: Secondary | ICD-10-CM | POA: Diagnosis not present

## 2022-09-18 DIAGNOSIS — R7303 Prediabetes: Secondary | ICD-10-CM | POA: Diagnosis not present

## 2022-09-23 ENCOUNTER — Ambulatory Visit: Payer: Medicare PPO | Admitting: Orthopedic Surgery

## 2022-09-24 ENCOUNTER — Ambulatory Visit (HOSPITAL_COMMUNITY): Payer: Medicare PPO | Attending: Orthopedic Surgery | Admitting: Physical Therapy

## 2022-09-24 ENCOUNTER — Other Ambulatory Visit: Payer: Self-pay

## 2022-09-24 ENCOUNTER — Encounter (HOSPITAL_COMMUNITY): Payer: Self-pay | Admitting: Physical Therapy

## 2022-09-24 DIAGNOSIS — M4317 Spondylolisthesis, lumbosacral region: Secondary | ICD-10-CM | POA: Diagnosis not present

## 2022-09-24 DIAGNOSIS — M5459 Other low back pain: Secondary | ICD-10-CM | POA: Insufficient documentation

## 2022-09-24 DIAGNOSIS — R2689 Other abnormalities of gait and mobility: Secondary | ICD-10-CM | POA: Insufficient documentation

## 2022-09-24 NOTE — Therapy (Deleted)
[facility] OUTPATIENT PHYSICAL THERAPY THORACOLUMBAR EVALUATION   Patient Name: Kimberly Woods MRN: [id number] DOB:1940-10-08, 82 y.o., female Today's Date: 09/24/2022  END OF SESSION:   Past Medical History:  Diagnosis Date   Allergy    Blood transfusion without reported diagnosis    after childbirth   Hypercholesterolemia 2018   Hypertension    Osteoporosis    Stroke (HCC) 05/2016   Past Surgical History:  Procedure Laterality Date   APPENDECTOMY  1963   TONSILLECTOMY  1949   TUBAL LIGATION  1974   VAGINAL HYSTERECTOMY     VITRECTOMY AND CATARACT Right 12/13/2016   Procedure: ANTERIOR VITRECTOMY AND CATARACT EXTRACTION RIGHT EYE CDE=18.58;  Surgeon: Fabio Pierce, MD;  Location: [facility];  Service: Ophthalmology;  Laterality: Right;   Patient Active Problem List   Diagnosis Date Noted   Spondylolisthesis, lumbar region 11/29/2021   Hypermagnesemia 11/29/2021   Hypokalemia 11/29/2021   Sciatica 11/07/2021   Pain of left lower leg 08/03/2021   Herpes zoster 06/02/2021   Left hemiplegia (HCC) 05/30/2021   Acute urinary tract infection 05/28/2021   Chronic low back pain 03/08/2021   Cervical radiculopathy 12/16/2020   Neck pain 12/16/2020   Impairment of balance 11/29/2020   Seborrheic keratosis 11/29/2020   Prediabetes 11/28/2020   Impaired fasting glucose 11/24/2020   CVA (cerebral vascular accident) (HCC) 06/05/2016   Hypertension 06/05/2016    PCP: Benita Stabile, MD   REFERRING PROVIDER:   Vickki Hearing, MD    REFERRING DIAG: [phone] (ICD-10-CM) - Spondylolisthesis, lumbosacral region   Rationale for Evaluation and Treatment: {HABREHAB:27488}  THERAPY DIAG:  No diagnosis found.  ONSET DATE: ***  SUBJECTIVE:                                                                                                                                                                                           SUBJECTIVE STATEMENT: ***  PERTINENT HISTORY:   ***  PAIN:  Are you having pain? {OPRCPAIN:27236}  PRECAUTIONS: {Therapy precautions:24002}  RED FLAGS: {PT Red Flags:29287}   WEIGHT BEARING RESTRICTIONS: {Yes ***/No:24003}  FALLS:  Has patient fallen in last 6 months? {fallsyesno:27318}  LIVING ENVIRONMENT: Lives with: {OPRC lives with:25569::"lives with their family"} Lives in: {Lives in:25570} Stairs: {opstairs:27293} Has following equipment at home: {Assistive devices:23999}  OCCUPATION: ***  PLOF: {PLOF:24004}  PATIENT GOALS: ***  NEXT MD VISIT: ***  OBJECTIVE:   DIAGNOSTIC FINDINGS:  ***  PATIENT SURVEYS:  {rehab surveys:24030}  SCREENING FOR RED FLAGS: Bowel or bladder incontinence: {Yes/No:304960894} Spinal tumors: {Yes/No:304960894} Cauda equina syndrome: {Yes/No:304960894} Compression fracture: {Yes/No:304960894} Abdominal aneurysm: {Yes/No:304960894}  COGNITION: Overall cognitive status: {cognition:24006}  SENSATION: {sensation:27233}  MUSCLE LENGTH: Hamstrings: Right *** deg; Left *** deg Maisie Fus test: Right *** deg; Left *** deg  POSTURE: {posture:25561}  PALPATION: ***  LUMBAR ROM:   AROM eval  Flexion   Extension   Right lateral flexion   Left lateral flexion   Right rotation   Left rotation    (Blank rows = not tested; * = limited by pain)  LOWER EXTREMITY ROM:     {AROM/PROM:27142}  Right eval Left eval  Hip flexion    Hip extension    Hip abduction    Hip adduction    Hip internal rotation    Hip external rotation    Knee flexion    Knee extension    Ankle dorsiflexion    Ankle plantarflexion    Ankle inversion    Ankle eversion     (Blank rows = not tested)  LOWER EXTREMITY MMT:    MMT Right eval Left eval  Hip flexion    Hip extension    Hip abduction    Hip adduction    Hip internal rotation    Hip external rotation    Knee flexion    Knee extension    Ankle dorsiflexion    Ankle plantarflexion    Ankle inversion    Ankle eversion      (Blank rows = not tested)  LUMBAR SPECIAL TESTS:  {lumbar special test:25242}  FUNCTIONAL TESTS:  {Functional tests:24029} {Functional tests:24029}  GAIT: Distance walked: *** Assistive device utilized: {Assistive devices:23999} Level of assistance: {Levels of assistance:24026} Comments: ***  TODAY'S TREATMENT:                                                                                                                              DATE: ***  Treatment Provided: ***    PATIENT EDUCATION:  Education details: *** Person educated: {Person educated:25204} Education method: {Education Method:25205} Education comprehension: {Education Comprehension:25206}  HOME EXERCISE PROGRAM: ***  ASSESSMENT:  CLINICAL IMPRESSION: Patient is a *** y.o. *** who was seen today for physical therapy evaluation and treatment for ***. Patient will benefit from PT to return to PLOF  OBJECTIVE IMPAIRMENTS: {opptimpairments:25111}.   ACTIVITY LIMITATIONS: {activitylimitations:27494}  PARTICIPATION LIMITATIONS: {participationrestrictions:25113}  PERSONAL FACTORS: {Personal factors:25162} are also affecting patient's functional outcome.   REHAB POTENTIAL: {rehabpotential:25112}  CLINICAL DECISION MAKING: {clinical decision making:25114}  EVALUATION COMPLEXITY: {Evaluation complexity:25115}   GOALS: Goals reviewed with patient? {yes/no:20286}  SHORT TERM GOALS: Target date: ***  Patient will be able to walk ***meters for the 2 Minute Walk Test with *** assistance to improve ADL completion, negotiate stairs, and walk community distances Baseline: Goal status: INITIAL  2.  Patient will score a *** on the {Functional tests:24029} to demonstrate an improvement in ADL completion, stair negotiation, household/community ambulation, and self-care Baseline:  Goal status: INITIAL  3. Patient will be independent with a basic stretching/strengthening HEP  Baseline:  Goal status:  INITIAL   LONG TERM GOALS:  Target date: ***  Patient will be able to walk ***meters for the 2 Minute Walk Test with *** assistance to improve ADL completion, negotiate stairs, and walk community distances Baseline:  Goal status: INITIAL  2.  Patient will score a *** on the {Functional tests:24029} to demonstrate an improvement in ADL completion, stair negotiation, household/community ambulation, and self-care Baseline:  Goal status: INITIAL  3.  Patient will be independent with a comprehensive strengthening HEP  Baseline:  Goal status: INITIAL   PLAN:  PT FREQUENCY: {rehab frequency:25116}  PT DURATION: {rehab duration:25117}  PLANNED INTERVENTIONS: {rehab planned interventions:25118::"Therapeutic exercises","Therapeutic activity","Neuromuscular re-education","Balance training","Gait training","Patient/Family education","Self Care","Joint mobilization"}.  PLAN FOR NEXT SESSION: Seymour Bars, PT 09/24/2022, 7:10 AM

## 2022-09-24 NOTE — Therapy (Signed)
[facility] OUTPATIENT PHYSICAL THERAPY THORACOLUMBAR EVALUATION   Patient Name: Kimberly Woods MRN: [id number] DOB:May 09, 1940, 82 y.o., female Today's Date: 09/24/2022  END OF SESSION:  PT End of Session - 09/24/22 0910     Visit Number 1    Number of Visits 16    Date for PT Re-Evaluation 11/19/22    Authorization Type [employer] ( auth-y, no visit limit)    Authorization Time Period 12 visits requested- check auth    Authorization - Visit Number 1    Authorization - Number of Visits 12    Progress Note Due on Visit 10    PT Start Time 0905    PT Stop Time 0945    PT Time Calculation (min) 40 min    Activity Tolerance Patient tolerated treatment well    Behavior During Therapy [facility] for tasks assessed/performed             Past Medical History:  Diagnosis Date   Allergy    Blood transfusion without reported diagnosis    after childbirth   Hypercholesterolemia 2018   Hypertension    Osteoporosis    Stroke (HCC) 05/2016   Past Surgical History:  Procedure Laterality Date   APPENDECTOMY  1963   TONSILLECTOMY  1949   TUBAL LIGATION  1974   VAGINAL HYSTERECTOMY     VITRECTOMY AND CATARACT Right 12/13/2016   Procedure: ANTERIOR VITRECTOMY AND CATARACT EXTRACTION RIGHT EYE CDE=18.58;  Surgeon: Fabio Pierce, MD;  Location: [facility];  Service: Ophthalmology;  Laterality: Right;   Patient Active Problem List   Diagnosis Date Noted   Spondylolisthesis, lumbar region 11/29/2021   Hypermagnesemia 11/29/2021   Hypokalemia 11/29/2021   Sciatica 11/07/2021   Pain of left lower leg 08/03/2021   Herpes zoster 06/02/2021   Left hemiplegia (HCC) 05/30/2021   Acute urinary tract infection 05/28/2021   Chronic low back pain 03/08/2021   Cervical radiculopathy 12/16/2020   Neck pain 12/16/2020   Impairment of balance 11/29/2020   Seborrheic keratosis 11/29/2020   Prediabetes 11/28/2020   Impaired fasting glucose 11/24/2020   CVA (cerebral vascular accident) (HCC)  06/05/2016   Hypertension 06/05/2016    PCP: Benita Stabile, MDRef Provider (PCP)   REFERRING PROVIDER:   Vickki Hearing, MD    REFERRING DIAG: M43.17 (ICD-10-CM) - Spondylolisthesis, lumbosacral region   Rationale for Evaluation and Treatment: Rehabilitation  THERAPY DIAG:  Other abnormalities of gait and mobility  Other low back pain  ONSET DATE: Feb 2024 --------------------------------------------------------------------------------------------- SUBJECTIVE:  SUBJECTIVE STATEMENT: Patient states she lives with husband. Her husband had hip surgery in Feb and fell in March causing him to undergo another hip surgery. Since then, patient takes care of her husband. Low back pain began late Spring 2024. Patient went to MD and prescribed medicine. Patient has been on muscle relaxer. Since July 15, patient still takes Celebrex and NSAID 2-3x a day. Patient will see her PCP this Wednesday.    PERTINENT HISTORY:  Hx CVA 2018, HTN, Osteoporosis, Spondylolisthesis  PAIN:  Are you having pain? Yes: NPRS scale: 3-4/10 Pain location: lumbar Pain description: dull/ache  Aggravating factors: ADLs Relieving factors: laying  PRECAUTIONS: None  RED FLAGS: None   WEIGHT BEARING RESTRICTIONS: No  FALLS:  Has patient fallen in last 6 months? Yes. Number of falls 1 in March 2024  LIVING ENVIRONMENT: Lives with: lives with their spouse Lives in: House/apartment Stairs: Yes: Internal: 1 level steps; patient does not use stairs  Has following equipment at home: None  OCCUPATION: retired  PLOF: Independent  PATIENT GOALS: To be able to walk pain free  NEXT MD VISIT: 09/25/22 --------------------------------------------------------------------------------------------- OBJECTIVE:   DIAGNOSTIC  FINDINGS:  X-rays show L4-5 grade 1/2 spondylolisthesis with mild curve which may be reactive and not structural   Degenerative changes are seen in the facet joints of L4-S1   Impression grade 1/2 spondylolisthesis with mild reactive curve and facet arthritis  PATIENT SURVEYS:    SCREENING FOR RED FLAGS: Bowel or bladder incontinence: No Spinal tumors: No Cauda equina syndrome: No Compression fracture: No Abdominal aneurysm: No  COGNITION: Overall cognitive status: Within functional limits for tasks assessed  POSTURE: increased thoracic kyphosis and anterior pelvic tilt      FUNCTIONAL TESTS:  5 times sit to stand: 10.7s Timed up and go (TUG): 12.5s  SENSATION: WFL  GAIT ANALYSIS: Distance walked: 25ft Assistive device utilized: None Level of assistance: Complete Independence Comments: Patient presents with unsteadiness on feet; minimal scissoring     LUMBAR ROM:   AROM eval  Flexion To shins  Extension 5  Right lateral flexion To mid thigh  Left lateral flexion To mid thigh  Right rotation   Left rotation    (Blank rows = not tested; * = limited by pain)  LOWER EXTREMITY MMT:    MMT Right eval Left eval  Hip flexion 3+/5 3+/5  Hip extension    Hip abduction    Hip adduction    Hip internal rotation    Hip external rotation    Knee flexion 3/5 3/5  Knee extension 4-/5 4-/5  Ankle dorsiflexion 3+/5 3+/5  Ankle plantarflexion    Ankle inversion    Ankle eversion     (Blank rows = not tested)   PALPATION: Minimal tenderness to palpation through lumbar paraspinals --------------------------------------------------------------------------------------------- TODAY'S TREATMENT:  DATE: 09/23/12  Therapeutic exercise x29min -Seated forward flexion 10x5"   PATIENT EDUCATION:  Education details: HEP, safety Person educated:  Patient Education method: Explanation and Demonstration Education comprehension: verbalized understanding  HOME EXERCISE PROGRAM: Access Code: [email or web address] URL: [email or web address] Date: 09/24/2022 - Seated Flexion Stretch  - 3 x daily - 7 x weekly - 10 reps - 5 second hold --------------------------------------------------------------------------------------------- ASSESSMENT:  CLINICAL IMPRESSION: Patient is a 82 y.o. female who was seen today for physical therapy evaluation and treatment for unsteadiness on feet and low back pain. Patient will benefit from PT to address the limitations/impairments listed below to return to prior level of function   OBJECTIVE IMPAIRMENTS: decreased balance, decreased endurance, difficulty walking, and decreased strength.   ACTIVITY LIMITATIONS: carrying, lifting, bending, sitting, standing, squatting, stairs, and dressing  PARTICIPATION LIMITATIONS: cleaning, laundry, driving, and community activity  PERSONAL FACTORS: Age and 1 comorbidity: stroke 2018  are also affecting patient's functional outcome.   REHAB POTENTIAL: Good  CLINICAL DECISION MAKING: Stable/uncomplicated  EVALUATION COMPLEXITY: Moderate  --------------------------------------------------------------------------------------------- GOALS: Goals reviewed with patient? Yes  SHORT TERM GOALS: Target date: 10/22/2022    Patient will be able to complete the Timed Up and Go Test (TUG) within 10 seconds to improve gait speed to facilitate safe ambulation around home and community  Baseline: Goal status: INITIAL  2.  .Patient will be able to complete the 5 Times Sit To Stand Test(5TSTS) within 12 seconds to improve lower extremity strength to facilitate safe ambulation around home and community  Goal status: INITIAL  3. Patient will be independent with a basic stretching/strengthening HEP  Baseline:  Goal status: INITIAL   LONG TERM GOALS: Target date: 11/19/2022     Patient will be able to complete the Timed Up and Go Test (TUG) within 8 seconds to improve gait speed to facilitate safe ambulation around home and community  Baseline:  Goal status: INITIAL  2.  Patient will be able to complete the 5 Times Sit To Stand Test(5TSTS) within 10 seconds to improve lower extremity strength to facilitate safe ambulation around home and community  Baseline:  Goal status: INITIAL  3.  Patient will be independent with a comprehensive strengthening HEP  Baseline:  Goal status: INITIAL  --------------------------------------------------------------------------------------------- PLAN:  PT FREQUENCY: 2x/week  PT DURATION: 8 weeks  PLANNED INTERVENTIONS: Therapeutic exercises, Therapeutic activity, Neuromuscular re-education, Balance training, Gait training, Patient/Family education, Self Care, and Joint mobilization.  PLAN FOR NEXT SESSION: Progress patient through basic lumbar stability and lower extremity strengthening routine/ HEP   Seymour Bars, PT 09/24/2022, 11:09 AM

## 2022-09-25 DIAGNOSIS — I6529 Occlusion and stenosis of unspecified carotid artery: Secondary | ICD-10-CM | POA: Diagnosis not present

## 2022-09-25 DIAGNOSIS — E876 Hypokalemia: Secondary | ICD-10-CM | POA: Diagnosis not present

## 2022-09-25 DIAGNOSIS — M7989 Other specified soft tissue disorders: Secondary | ICD-10-CM | POA: Diagnosis not present

## 2022-09-25 DIAGNOSIS — Z8673 Personal history of transient ischemic attack (TIA), and cerebral infarction without residual deficits: Secondary | ICD-10-CM | POA: Diagnosis not present

## 2022-09-25 DIAGNOSIS — R7303 Prediabetes: Secondary | ICD-10-CM | POA: Diagnosis not present

## 2022-09-25 DIAGNOSIS — E782 Mixed hyperlipidemia: Secondary | ICD-10-CM | POA: Diagnosis not present

## 2022-09-25 DIAGNOSIS — M4316 Spondylolisthesis, lumbar region: Secondary | ICD-10-CM | POA: Diagnosis not present

## 2022-09-25 DIAGNOSIS — I10 Essential (primary) hypertension: Secondary | ICD-10-CM | POA: Diagnosis not present

## 2022-09-26 ENCOUNTER — Ambulatory Visit (HOSPITAL_COMMUNITY): Payer: Medicare PPO

## 2022-09-26 DIAGNOSIS — M5459 Other low back pain: Secondary | ICD-10-CM | POA: Diagnosis not present

## 2022-09-26 DIAGNOSIS — M4317 Spondylolisthesis, lumbosacral region: Secondary | ICD-10-CM | POA: Diagnosis not present

## 2022-09-26 DIAGNOSIS — H35033 Hypertensive retinopathy, bilateral: Secondary | ICD-10-CM | POA: Diagnosis not present

## 2022-09-26 DIAGNOSIS — R2689 Other abnormalities of gait and mobility: Secondary | ICD-10-CM

## 2022-09-26 NOTE — Therapy (Signed)
[facility] OUT[facility]IENT PHYSICAL THERAPY TREATMENT NOTE   Patient Name: Kimberly Woods MRN: [id number] DOB:May 15, 1940, 82 y.o., female Today's Date: 09/26/2022  END OF SESSION:  PT End of Session - 09/26/22 0950     Visit Number 2    Number of Visits 16    Date for PT Re-Evaluation 11/19/22    Authorization Type [employer] ( auth-y; no visit limit)    Authorization Time Period 12    Authorization - Visit Number 2    Authorization - Number of Visits 12    Progress Note Due on Visit 10    PT Start Time 0945    PT Stop Time 1030    PT Time Calculation (min) 45 min              Past Medical History:  Diagnosis Date   Allergy    Blood transfusion without reported diagnosis    after childbirth   Hypercholesterolemia 2018   Hypertension    Osteoporosis    Stroke (HCC) 05/2016   Past Surgical History:  Procedure Laterality Date   APPENDECTOMY  1963   TONSILLECTOMY  1949   TUBAL LIGATION  1974   VAGINAL HYSTERECTOMY     VITRECTOMY AND CATARACT Right 12/13/2016   Procedure: ANTERIOR VITRECTOMY AND CATARACT EXTRACTION RIGHT EYE CDE=18.58;  Surgeon: Fabio Pierce, MD;  Location: [facility];  Service: Ophthalmology;  Laterality: Right;   Patient Active Problem List   Diagnosis Date Noted   Spondylolisthesis, lumbar region 11/29/2021   Hypermagnesemia 11/29/2021   Hypokalemia 11/29/2021   Sciatica 11/07/2021   Pain of left lower leg 08/03/2021   Herpes zoster 06/02/2021   Left hemiplegia (HCC) 05/30/2021   Acute urinary tract infection 05/28/2021   Chronic low back pain 03/08/2021   Cervical radiculopathy 12/16/2020   Neck pain 12/16/2020   Impairment of balance 11/29/2020   Seborrheic keratosis 11/29/2020   Prediabetes 11/28/2020   Impaired fasting glucose 11/24/2020   CVA (cerebral vascular accident) (HCC) 06/05/2016   Hypertension 06/05/2016    PCP: Benita Stabile, MDRef Provider (PCP)   REFERRING PROVIDER:   Vickki Hearing, MD    REFERRING DIAG:  M43.17 (ICD-10-CM) - Spondylolisthesis, lumbosacral region   Rationale for Evaluation and Treatment: Rehabilitation  THERAPY DIAG:  Other abnormalities of gait and mobility  ONSET DATE: Feb 2024 --------------------------------------------------------------------------------------------- SUBJECTIVE:                                                                                                                                                                                           SUBJECTIVE STATEMENT:  Today: Patient states she had her MD follow  up yesterday. MD stated patient can " experiment" with her pain medicine when needed due to pain.    Eval: Patient states she lives with husband. Her husband had hip surgery in Feb and fell in March causing him to undergo another hip surgery. Since then, patient takes care of her husband. Low back pain began late Spring 2024. Patient went to MD and prescribed medicine. Patient has been on muscle relaxer. Since July 15, patient still takes Celebrex and NSAID 2-3x a day. Patient will see her PCP this Wednesday.    PERTINENT HISTORY:  Hx CVA 2018, HTN, Osteoporosis, Spondylolisthesis  PAIN:  Are you having pain? Yes: NPRS scale: 6-7/10 Pain location: lumbar Pain description: dull/ache  Aggravating factors: ADLs Relieving factors: laying  PRECAUTIONS: None  RED FLAGS: None   WEIGHT BEARING RESTRICTIONS: No  FALLS:  Has patient fallen in last 6 months? Yes. Number of falls 1 in March 2024  LIVING ENVIRONMENT: Lives with: lives with their spouse Lives in: House/apartment Stairs: Yes: Internal: 1 level steps; patient does not use stairs  Has following equipment at home: None  OCCUPATION: retired  PLOF: Independent  PATIENT GOALS: To be able to walk pain free  NEXT MD VISIT: FEB 2024 --------------------------------------------------------------------------------------------- OBJECTIVE:   DIAGNOSTIC FINDINGS:  X-rays show L4-5  grade 1/2 spondylolisthesis with mild curve which may be reactive and not structural   Degenerative changes are seen in the facet joints of L4-S1   Impression grade 1/2 spondylolisthesis with mild reactive curve and facet arthritis  PATIENT SURVEYS:    SCREENING FOR RED FLAGS: Bowel or bladder incontinence: No Spinal tumors: No Cauda equina syndrome: No Compression fracture: No Abdominal aneurysm: No  COGNITION: Overall cognitive status: Within functional limits for tasks assessed  POSTURE: increased thoracic kyphosis and anterior pelvic tilt      FUNCTIONAL TESTS:  5 times sit to stand: 10.7s Timed up and go (TUG): 12.5s  SENSATION: WFL  GAIT ANALYSIS: Distance walked: 49ft Assistive device utilized: None Level of assistance: Complete Independence Comments: Patient presents with unsteadiness on feet; minimal scissoring     LUMBAR ROM:   AROM eval  Flexion To shins  Extension 5  Right lateral flexion To mid thigh  Left lateral flexion To mid thigh  Right rotation   Left rotation    (Blank rows = not tested; * = limited by pain)  LOWER EXTREMITY MMT:    MMT Right eval Left eval  Hip flexion 3+/5 3+/5  Hip extension    Hip abduction    Hip adduction    Hip internal rotation    Hip external rotation    Knee flexion 3/5 3/5  Knee extension 4-/5 4-/5  Ankle dorsiflexion 3+/5 3+/5  Ankle plantarflexion    Ankle inversion    Ankle eversion     (Blank rows = not tested)   PALPATION: Minimal tenderness to palpation through lumbar paraspinals --------------------------------------------------------------------------------------------- TODAY'S TREATMENT:  DATE:   09/25/12 Therapeutic exercise x40 min - Nu Step Seat #7, handles #10 Warm Up 3-5 min, as tolerated \ - Supine Double Knee to Chest  - 2 x daily - 5 x weekly - 10 reps -  3second hold - Supine Single Knee to Chest Stretch  - 2 x daily - 5 x weekly - 5 reps - 3 seconds hold - Supine Lower Trunk Rotation  - 2 x daily - 5 x weekly - 10 reps - 3 seconds hold - Supine Hip Adduction Isometric with Ball  - 2 x daily - 5 x weekly - 10 reps - 3 seconds hold - Clamshell with Resistance  - 2 x daily - 7 x weekly - 10 reps - 3 seconds  hold      09/23/12 Therapeutic exercise x49min -Seated forward flexion 10x5"   PATIENT EDUCATION:  Education details: HEP, safety Person educated: Patient Education method: Explanation and Demonstration Education comprehension: verbalized understanding  HOME EXERCISE PROGRAM: Access Code: [email or web address] URL: [email or web address] Date: 09/26/2022 Prepared by: Seymour Bars  Exercises - Supine Double Knee to Chest  - 2 x daily - 5 x weekly - 10 reps - 3second hold - Supine Single Knee to Chest Stretch  - 2 x daily - 5 x weekly - 5 reps - 3 seconds hold - Supine Lower Trunk Rotation  - 2 x daily - 5 x weekly - 10 reps - 3 seconds hold - Supine Hip Adduction Isometric with Ball  - 2 x daily - 5 x weekly - 10 reps - 3 seconds hold - Clamshell with Resistance  - 2 x daily - 7 x weekly - 10 reps - 3 seconds  hold --------------------------------------------------------------------------------------------- ASSESSMENT:  CLINICAL IMPRESSION:  Today: PT initiated basic low back pain/ lower extremity strength HEP. Pt taken through each exercise with no increase in pain levels.   --------------------------------------------------------------------------------------------------------------------------------------- EVAL: Patient is a 82 y.o. female who was seen today for physical therapy evaluation and treatment for unsteadiness on feet and low back pain. Patient will benefit from PT to address the limitations/impairments listed below to return to prior level of function   OBJECTIVE IMPAIRMENTS: decreased balance, decreased  endurance, difficulty walking, and decreased strength.   ACTIVITY LIMITATIONS: carrying, lifting, bending, sitting, standing, squatting, stairs, and dressing  PARTICIPATION LIMITATIONS: cleaning, laundry, driving, and community activity  PERSONAL FACTORS: Age and 1 comorbidity: stroke 2018  are also affecting patient's functional outcome.   REHAB POTENTIAL: Good  CLINICAL DECISION MAKING: Stable/uncomplicated  EVALUATION COMPLEXITY: Moderate  --------------------------------------------------------------------------------------------- GOALS: Goals reviewed with patient? Yes  SHORT TERM GOALS: Target date: 10/22/2022    Patient will be able to complete the Timed Up and Go Test (TUG) within 10 seconds to improve gait speed to facilitate safe ambulation around home and community  Baseline: Goal status: INITIAL  2.  .Patient will be able to complete the 5 Times Sit To Stand Test(5TSTS) within 12 seconds to improve lower extremity strength to facilitate safe ambulation around home and community  Goal status: INITIAL  3. Patient will be independent with a basic stretching/strengthening HEP  Baseline:  Goal status: INITIAL   LONG TERM GOALS: Target date: 11/19/2022    Patient will be able to complete the Timed Up and Go Test (TUG) within 8 seconds to improve gait speed to facilitate safe ambulation around home and community  Baseline:  Goal status: INITIAL  2.  Patient will be able to complete the 5 Times Sit To Stand Test(5TSTS) within 10 seconds  to improve lower extremity strength to facilitate safe ambulation around home and community  Baseline:  Goal status: INITIAL  3.  Patient will be independent with a comprehensive strengthening HEP  Baseline:  Goal status: INITIAL  --------------------------------------------------------------------------------------------- PLAN:  PT FREQUENCY: 2x/week  PT DURATION: 8 weeks  PLANNED INTERVENTIONS: Therapeutic exercises,  Therapeutic activity, Neuromuscular re-education, Balance training, Gait training, Patient/Family education, Self Care, and Joint mobilization.  PLAN FOR NEXT SESSION: Progress patient through basic lumbar stability and lower extremity strengthening routine/ HEP. Please review HEP to confirm understanding   Seymour Bars, PT 09/26/2022, 9:52 AM

## 2022-10-01 ENCOUNTER — Ambulatory Visit (HOSPITAL_COMMUNITY): Payer: Medicare PPO

## 2022-10-01 DIAGNOSIS — M5459 Other low back pain: Secondary | ICD-10-CM | POA: Diagnosis not present

## 2022-10-01 DIAGNOSIS — M4317 Spondylolisthesis, lumbosacral region: Secondary | ICD-10-CM | POA: Diagnosis not present

## 2022-10-01 DIAGNOSIS — R2689 Other abnormalities of gait and mobility: Secondary | ICD-10-CM

## 2022-10-01 NOTE — Therapy (Signed)
[facility] PHYSICAL THERAPY TREATMENT NOTE   Patient Name: Kimberly Woods MRN: [id number] DOB:30-May-1940, 82 y.o., female Today's Date: 10/01/2022  END OF SESSION:  PT End of Session - 10/01/22 0952     Visit Number 3    Number of Visits 16    Date for PT Re-Evaluation 11/19/22    Authorization Type [employer] ( auth-y; no visit limit)    Authorization Time Period 12    Authorization - Number of Visits 12    Progress Note Due on Visit 10               Past Medical History:  Diagnosis Date   Allergy    Blood transfusion without reported diagnosis    after childbirth   Hypercholesterolemia 2018   Hypertension    Osteoporosis    Stroke (HCC) 05/2016   Past Surgical History:  Procedure Laterality Date   APPENDECTOMY  1963   TONSILLECTOMY  1949   TUBAL LIGATION  1974   VAGINAL HYSTERECTOMY     VITRECTOMY AND CATARACT Right 12/13/2016   Procedure: ANTERIOR VITRECTOMY AND CATARACT EXTRACTION RIGHT EYE CDE=18.58;  Surgeon: Fabio Pierce, MD;  Location: [facility];  Service: Ophthalmology;  Laterality: Right;   Patient Active Problem List   Diagnosis Date Noted   Spondylolisthesis, lumbar region 11/29/2021   Hypermagnesemia 11/29/2021   Hypokalemia 11/29/2021   Sciatica 11/07/2021   Pain of left lower leg 08/03/2021   Herpes zoster 06/02/2021   Left hemiplegia (HCC) 05/30/2021   Acute urinary tract infection 05/28/2021   Chronic low back pain 03/08/2021   Cervical radiculopathy 12/16/2020   Neck pain 12/16/2020   Impairment of balance 11/29/2020   Seborrheic keratosis 11/29/2020   Prediabetes 11/28/2020   Impaired fasting glucose 11/24/2020   CVA (cerebral vascular accident) (HCC) 06/05/2016   Hypertension 06/05/2016    PCP: Benita Stabile, MDRef Provider (PCP)   REFERRING PROVIDER:   Vickki Hearing, MD    REFERRING DIAG: M43.17 (ICD-10-CM) - Spondylolisthesis, lumbosacral region   Rationale for Evaluation and Treatment:  Rehabilitation  THERAPY DIAG:  No diagnosis found.  ONSET DATE: Feb 2024 --------------------------------------------------------------------------------------------- SUBJECTIVE:                                                                                                                                                                                           SUBJECTIVE STATEMENT:  Today: Patient states she has been able to wean off her pain medicines more with her HEP.     Eval: Patient states she lives with husband. Her husband had hip surgery in Feb and fell in March causing him  to undergo another hip surgery. Since then, patient takes care of her husband. Low back pain began late Spring 2024. Patient went to MD and prescribed medicine. Patient has been on muscle relaxer. Since July 15, patient still takes Celebrex and NSAID 2-3x a day. Patient will see her PCP this Wednesday.    PERTINENT HISTORY:  Hx CVA 2018, HTN, Osteoporosis, Spondylolisthesis  PAIN:  Are you having pain? Yes: NPRS scale: 4-5/10 Pain location: lumbar Pain description: dull/ache  Aggravating factors: ADLs Relieving factors: laying  PRECAUTIONS: None  RED FLAGS: None   WEIGHT BEARING RESTRICTIONS: No  FALLS:  Has patient fallen in last 6 months? Yes. Number of falls 1 in March 2024  LIVING ENVIRONMENT: Lives with: lives with their spouse Lives in: House/apartment Stairs: Yes: Internal: 1 level steps; patient does not use stairs  Has following equipment at home: None  OCCUPATION: retired  PLOF: Independent  PATIENT GOALS: To be able to walk pain free  NEXT MD VISIT: FEB 2024 --------------------------------------------------------------------------------------------- OBJECTIVE:   DIAGNOSTIC FINDINGS:  X-rays show L4-5 grade 1/2 spondylolisthesis with mild curve which may be reactive and not structural   Degenerative changes are seen in the facet joints of L4-S1   Impression grade 1/2  spondylolisthesis with mild reactive curve and facet arthritis  PATIENT SURVEYS:    SCREENING FOR RED FLAGS: Bowel or bladder incontinence: No Spinal tumors: No Cauda equina syndrome: No Compression fracture: No Abdominal aneurysm: No  COGNITION: Overall cognitive status: Within functional limits for tasks assessed  POSTURE: increased thoracic kyphosis and anterior pelvic tilt      FUNCTIONAL TESTS:  5 times sit to stand: 10.7s Timed up and go (TUG): 12.5s  SENSATION: WFL  GAIT ANALYSIS: Distance walked: 20ft Assistive device utilized: None Level of assistance: Complete Independence Comments: Patient presents with unsteadiness on feet; minimal scissoring     LUMBAR ROM:   AROM eval  Flexion To shins  Extension 5  Right lateral flexion To mid thigh  Left lateral flexion To mid thigh  Right rotation   Left rotation    (Blank rows = not tested; * = limited by pain)  LOWER EXTREMITY MMT:    MMT Right eval Left eval  Hip flexion 3+/5 3+/5  Hip extension    Hip abduction    Hip adduction    Hip internal rotation    Hip external rotation    Knee flexion 3/5 3/5  Knee extension 4-/5 4-/5  Ankle dorsiflexion 3+/5 3+/5  Ankle plantarflexion    Ankle inversion    Ankle eversion     (Blank rows = not tested)   PALPATION: Minimal tenderness to palpation through lumbar paraspinals --------------------------------------------------------------------------------------------- TODAY'S TREATMENT:                                                                                                                              DATE:   10/01/22 Supine Single Knee to Chest Stretch  - 2 x daily -  5 x weekly - 5 reps - 3 seconds hold Supine Bridge with Resistance Band  - 2 x daily - 5 x weekly - 3 sets - 10 reps Clamshell with Resistance  - 2 x daily - 5 x weekly - 10 reps - 3 seconds  hold Prone Press Up On Elbows  - 2 x daily - 5 x weekly - 10 reps Quadruped Rocking Slow   - 2 x daily - 5 x weekly - 10 reps      09/26/12 Therapeutic exercise x40 min - Nu Step Seat #7, handles #10 Warm Up 3-5 min, as tolerated \ - Supine Double Knee to Chest  - 2 x daily - 5 x weekly - 10 reps - 3second hold - Supine Single Knee to Chest Stretch  - 2 x daily - 5 x weekly - 5 reps - 3 seconds hold - Supine Lower Trunk Rotation  - 2 x daily - 5 x weekly - 10 reps - 3 seconds hold - Supine Hip Adduction Isometric with Ball  - 2 x daily - 5 x weekly - 10 reps - 3 seconds hold - Clamshell with Resistance  - 2 x daily - 7 x weekly - 10 reps - 3 seconds  hold     09/23/12 Therapeutic exercise x6min -Seated forward flexion 10x5"   PATIENT EDUCATION:  Education details: HEP, safety Person educated: Patient Education method: Explanation and Demonstration Education comprehension: verbalized understanding  HOME EXERCISE PROGRAM: Access Code: [email or web address] URL: [email or web address] Date: 09/26/2022 Prepared by: Seymour Bars Exercises - Supine Single Knee to Chest Stretch  - 2 x daily - 5 x weekly - 5 reps - 3 seconds hold - Supine Bridge with Resistance Band  - 2 x daily - 5 x weekly - 3 sets - 10 reps - Clamshell with Resistance  - 2 x daily - 5 x weekly - 10 reps - 3 seconds  hold - Prone Press Up On Elbows  - 2 x daily - 5 x weekly - 10 reps - Quadruped Rocking Slow  - 2 x daily - 5 x weekly - 10 reps --------------------------------------------------------------------------------------------- ASSESSMENT:  CLINICAL IMPRESSION:  Today: PT initiated progressive basic low back pain/ lower extremity strength HEP. Pt taken through each exercise with no increase in pain levels.   ------------------------------------------------------------------------------------------------------------------ EVAL: Patient is a 82 y.o. female who was seen today for physical therapy evaluation and treatment for unsteadiness on feet and low back pain. Patient will benefit from PT  to address the limitations/impairments listed below to return to prior level of function   OBJECTIVE IMPAIRMENTS: decreased balance, decreased endurance, difficulty walking, and decreased strength.   ACTIVITY LIMITATIONS: carrying, lifting, bending, sitting, standing, squatting, stairs, and dressing  PARTICIPATION LIMITATIONS: cleaning, laundry, driving, and community activity  PERSONAL FACTORS: Age and 1 comorbidity: stroke 2018  are also affecting patient's functional outcome.   REHAB POTENTIAL: Good  CLINICAL DECISION MAKING: Stable/uncomplicated  EVALUATION COMPLEXITY: Moderate  --------------------------------------------------------------------------------------------- GOALS: Goals reviewed with patient? Yes  SHORT TERM GOALS: Target date: 10/22/2022    Patient will be able to complete the Timed Up and Go Test (TUG) within 10 seconds to improve gait speed to facilitate safe ambulation around home and community  Baseline: Goal status: INITIAL  2.  .Patient will be able to complete the 5 Times Sit To Stand Test(5TSTS) within 12 seconds to improve lower extremity strength to facilitate safe ambulation around home and community  Goal status: INITIAL  3. Patient will be independent with a  basic stretching/strengthening HEP  Baseline:  Goal status: INITIAL   LONG TERM GOALS: Target date: 11/19/2022    Patient will be able to complete the Timed Up and Go Test (TUG) within 8 seconds to improve gait speed to facilitate safe ambulation around home and community  Baseline:  Goal status: INITIAL  2.  Patient will be able to complete the 5 Times Sit To Stand Test(5TSTS) within 10 seconds to improve lower extremity strength to facilitate safe ambulation around home and community  Baseline:  Goal status: INITIAL  3.  Patient will be independent with a comprehensive strengthening HEP  Baseline:  Goal status:  INITIAL  --------------------------------------------------------------------------------------------- PLAN:  PT FREQUENCY: 2x/week  PT DURATION: 8 weeks  PLANNED INTERVENTIONS: Therapeutic exercises, Therapeutic activity, Neuromuscular re-education, Balance training, Gait training, Patient/Family education, Self Care, and Joint mobilization.  PLAN FOR NEXT SESSION: Progress patient through basic lumbar stability and lower extremity strengthening routine/ HEP. Please review HEP to confirm understanding   Seymour Bars, PT 10/01/2022, 10:01 AM

## 2022-10-03 ENCOUNTER — Ambulatory Visit (HOSPITAL_COMMUNITY): Payer: Medicare PPO

## 2022-10-03 DIAGNOSIS — M4317 Spondylolisthesis, lumbosacral region: Secondary | ICD-10-CM | POA: Diagnosis not present

## 2022-10-03 DIAGNOSIS — R2689 Other abnormalities of gait and mobility: Secondary | ICD-10-CM

## 2022-10-03 DIAGNOSIS — M5459 Other low back pain: Secondary | ICD-10-CM

## 2022-10-03 NOTE — Therapy (Signed)
[facility] OUT[facility]IENT PHYSICAL THERAPY TREATMENT NOTE   Patient Name: Kimberly Woods MRN: [id number] DOB:[birth year], 82 y.o., female Today's Date: 10/03/2022  END OF SESSION:  PT End of Session - 10/03/22 1033     Visit Number 4    Number of Visits 16    Date for PT Re-Evaluation 11/19/22    Authorization Type [employer] ( auth-y; no visit limit)    Authorization Time Period 12    Authorization - Number of Visits 12    Progress Note Due on Visit 10    PT Start Time 1030    PT Stop Time 1110    PT Time Calculation (min) 40 min    Activity Tolerance Patient tolerated treatment well               Past Medical History:  Diagnosis Date   Allergy    Blood transfusion without reported diagnosis    after childbirth   Hypercholesterolemia 2018   Hypertension    Osteoporosis    Stroke (HCC) 05/2016   Past Surgical History:  Procedure Laterality Date   APPENDECTOMY  1963   TONSILLECTOMY  1949   TUBAL LIGATION  1974   VAGINAL HYSTERECTOMY     VITRECTOMY AND CATARACT Right 12/13/2016   Procedure: ANTERIOR VITRECTOMY AND CATARACT EXTRACTION RIGHT EYE CDE=18.58;  Surgeon: Fabio Pierce, MD;  Location: [facility];  Service: Ophthalmology;  Laterality: Right;   Patient Active Problem List   Diagnosis Date Noted   Spondylolisthesis, lumbar region 11/29/2021   Hypermagnesemia 11/29/2021   Hypokalemia 11/29/2021   Sciatica 11/07/2021   Pain of left lower leg 08/03/2021   Herpes zoster 06/02/2021   Left hemiplegia (HCC) 05/30/2021   Acute urinary tract infection 05/28/2021   Chronic low back pain 03/08/2021   Cervical radiculopathy 12/16/2020   Neck pain 12/16/2020   Impairment of balance 11/29/2020   Seborrheic keratosis 11/29/2020   Prediabetes 11/28/2020   Impaired fasting glucose 11/24/2020   CVA (cerebral vascular accident) (HCC) 06/05/2016   Hypertension 06/05/2016    PCP: Benita Stabile, MDRef Provider (PCP)   REFERRING PROVIDER:   Vickki Hearing, MD     REFERRING DIAG: M43.17 (ICD-10-CM) - Spondylolisthesis, lumbosacral region   Rationale for Evaluation and Treatment: Rehabilitation  THERAPY DIAG:  No diagnosis found.  ONSET DATE: Feb 2024 --------------------------------------------------------------------------------------------- SUBJECTIVE:                                                                                                                                                                                           SUBJECTIVE STATEMENT:  Today: Patient states she was unable to maintain quadruped  during her HEP due to left knee pain.   Eval: Patient states she lives with husband. Her husband had hip surgery in Feb and fell in March causing him to undergo another hip surgery. Since then, patient takes care of her husband. Low back pain began late Spring 2024. Patient went to [facility] and prescribed medicine. Patient has been on muscle relaxer. Since July 15, patient still takes Celebrex and NSAID 2-3x a day. Patient will see her PCP this Wednesday.    PERTINENT HISTORY:  Hx CVA 2018, HTN, Osteoporosis, Spondylolisthesis  PAIN:  Are you having pain? Yes: NPRS scale: 4/10 Pain location: lumbar Pain description: dull/ache  Aggravating factors: ADLs Relieving factors: laying  PRECAUTIONS: None  RED FLAGS: None   WEIGHT BEARING RESTRICTIONS: No  FALLS:  Has patient fallen in last 6 months? Yes. Number of falls 1 in March 2024  LIVING ENVIRONMENT: Lives with: lives with their spouse Lives in: House/apartment Stairs: Yes: Internal: 1 level steps; patient does not use stairs  Has following equipment at home: None  OCCUPATION: retired  PLOF: Independent  PATIENT GOALS: To be able to walk pain free  NEXT MD VISIT: FEB 2024 --------------------------------------------------------------------------------------------- OBJECTIVE:   DIAGNOSTIC FINDINGS:  X-rays show L4-5 grade 1/2 spondylolisthesis with mild curve  which may be reactive and not structural   Degenerative changes are seen in the facet joints of L4-S1   Impression grade 1/2 spondylolisthesis with mild reactive curve and facet arthritis  PATIENT SURVEYS:    SCREENING FOR RED FLAGS: Bowel or bladder incontinence: No Spinal tumors: No Cauda equina syndrome: No Compression fracture: No Abdominal aneurysm: No  COGNITION: Overall cognitive status: Within functional limits for tasks assessed  POSTURE: increased thoracic kyphosis and anterior pelvic tilt      FUNCTIONAL TESTS:  5 times sit to stand: 10.7s Timed up and go (TUG): 12.5s  SENSATION: WFL  GAIT ANALYSIS: Distance walked: 92ft Assistive device utilized: None Level of assistance: Complete Independence Comments: Patient presents with unsteadiness on feet; minimal scissoring     LUMBAR ROM:   AROM eval  Flexion To shins  Extension 5  Right lateral flexion To mid thigh  Left lateral flexion To mid thigh  Right rotation   Left rotation    (Blank rows = not tested; * = limited by pain)  LOWER EXTREMITY MMT:    MMT Right eval Left eval  Hip flexion 3+/5 3+/5  Hip extension    Hip abduction    Hip adduction    Hip internal rotation    Hip external rotation    Knee flexion 3/5 3/5  Knee extension 4-/5 4-/5  Ankle dorsiflexion 3+/5 3+/5  Ankle plantarflexion    Ankle inversion    Ankle eversion     (Blank rows = not tested)   PALPATION: Minimal tenderness to palpation through lumbar paraspinals --------------------------------------------------------------------------------------------- TODAY'S TREATMENT:  DATE:   10/01/22  Gait Training x37minutes - 2 pt step-to gait using right single point cane with SBA   Therapeutic exercise x44minutes - NuStep L3, Handles #8, Seat #8  -Seated forward flexion with Blue Physioball  10x5"     10/01/22 Supine Single Knee to Chest Stretch  - 2 x daily - 5 x weekly - 5 reps - 3 seconds hold Supine Bridge with Resistance Band  - 2 x daily - 5 x weekly - 3 sets - 10 reps Clamshell with Resistance  - 2 x daily - 5 x weekly - 10 reps - 3 seconds  hold Prone Press Up On Elbows  - 2 x daily - 5 x weekly - 10 reps Quadruped Rocking Slow  - 2 x daily - 5 x weekly - 10 reps   PATIENT EDUCATION:  Education details: HEP, safety Person educated: Patient Education method: Explanation and Demonstration Education comprehension: verbalized understanding  HOME EXERCISE PROGRAM: Access Code: [email or web address] URL: [email or web address] Date: 09/26/2022 Prepared by: Seymour Bars Exercises - Supine Single Knee to Chest Stretch  - 2 x daily - 5 x weekly - 5 reps - 3 seconds hold - Supine Bridge with Resistance Band  - 2 x daily - 5 x weekly - 3 sets - 10 reps - Clamshell with Resistance  - 2 x daily - 5 x weekly - 10 reps - 3 seconds  hold - Prone Press Up On Elbows  - 2 x daily - 5 x weekly - 10 reps - Quadruped Rocking Slow  - 2 x daily - 5 x weekly - 10 reps --------------------------------------------------------------------------------------------- ASSESSMENT:  CLINICAL IMPRESSION:  Today: Patient entered PT clinic today with MOD unsteadiness. PT focused session on Gait Training with single point cane to facilitate antalgic gait today. Pt shows increased steadiness with single point cane but lacks coordination corrected with verbal cues. Patient tolerated well.   ------------------------------------------------------------------------------------------------------------------ EVAL: Patient is a 82 y.o. female who was seen today for physical therapy evaluation and treatment for unsteadiness on feet and low back pain. Patient will benefit from PT to address the limitations/impairments listed below to return to prior level of function   OBJECTIVE IMPAIRMENTS: decreased  balance, decreased endurance, difficulty walking, and decreased strength.   ACTIVITY LIMITATIONS: carrying, lifting, bending, sitting, standing, squatting, stairs, and dressing  PARTICIPATION LIMITATIONS: cleaning, laundry, driving, and community activity  PERSONAL FACTORS: Age and 1 comorbidity: stroke 2018  are also affecting patient's functional outcome.   REHAB POTENTIAL: Good  CLINICAL DECISION MAKING: Stable/uncomplicated  EVALUATION COMPLEXITY: Moderate  --------------------------------------------------------------------------------------------- GOALS: Goals reviewed with patient? Yes  SHORT TERM GOALS: Target date: 10/22/2022    Patient will be able to complete the Timed Up and Go Test (TUG) within 10 seconds to improve gait speed to facilitate safe ambulation around home and community  Baseline: Goal status: INITIAL  2.  .Patient will be able to complete the 5 Times Sit To Stand Test(5TSTS) within 12 seconds to improve lower extremity strength to facilitate safe ambulation around home and community  Goal status: INITIAL  3. Patient will be independent with a basic stretching/strengthening HEP  Baseline:  Goal status: INITIAL   LONG TERM GOALS: Target date: 11/19/2022    Patient will be able to complete the Timed Up and Go Test (TUG) within 8 seconds to improve gait speed to facilitate safe ambulation around home and community  Baseline:  Goal status: INITIAL  2.  Patient will be able to complete the 5 Times Sit To Stand  Test(5TSTS) within 10 seconds to improve lower extremity strength to facilitate safe ambulation around home and community  Baseline:  Goal status: INITIAL  3.  Patient will be independent with a comprehensive strengthening HEP  Baseline:  Goal status: INITIAL  --------------------------------------------------------------------------------------------- PLAN:  PT FREQUENCY: 2x/week  PT DURATION: 8 weeks  PLANNED INTERVENTIONS: Therapeutic  exercises, Therapeutic activity, Neuromuscular re-education, Balance training, Gait training, Patient/Family education, Self Care, and Joint mobilization.  PLAN FOR NEXT SESSION: Progress patient through basic lumbar stability and lower extremity strengthening routine/ HEP. Please review HEP to confirm understanding   Seymour Bars, PT 10/03/2022, 10:34 AM

## 2022-10-08 ENCOUNTER — Ambulatory Visit (HOSPITAL_COMMUNITY): Payer: Medicare PPO

## 2022-10-08 ENCOUNTER — Encounter (HOSPITAL_COMMUNITY): Payer: Medicare PPO

## 2022-10-08 DIAGNOSIS — M4317 Spondylolisthesis, lumbosacral region: Secondary | ICD-10-CM | POA: Diagnosis not present

## 2022-10-08 DIAGNOSIS — M5459 Other low back pain: Secondary | ICD-10-CM | POA: Diagnosis not present

## 2022-10-08 DIAGNOSIS — R2689 Other abnormalities of gait and mobility: Secondary | ICD-10-CM

## 2022-10-08 NOTE — Therapy (Signed)
[facility] PHYSICAL THERAPY TREATMENT NOTE   Patient Name: Kimberly Woods MRN: [id number] DOB:08-Nov-1940, 82 y.o., female Today's Date: 10/08/2022  END OF SESSION:  PT End of Session - 10/08/22 1435     Visit Number 5    Number of Visits 16    Date for PT Re-Evaluation 11/19/22    Authorization Type [employer] ( auth-y; no visit limit)    Authorization Time Period 12    Authorization - Number of Visits 12    Progress Note Due on Visit 10    PT Start Time 1432    PT Stop Time 1510    PT Time Calculation (min) 38 min    Activity Tolerance Patient tolerated treatment well                Past Medical History:  Diagnosis Date   Allergy    Blood transfusion without reported diagnosis    after childbirth   Hypercholesterolemia 2018   Hypertension    Osteoporosis    Stroke (HCC) 05/2016   Past Surgical History:  Procedure Laterality Date   APPENDECTOMY  1963   TONSILLECTOMY  1949   TUBAL LIGATION  1974   VAGINAL HYSTERECTOMY     VITRECTOMY AND CATARACT Right 12/13/2016   Procedure: ANTERIOR VITRECTOMY AND CATARACT EXTRACTION RIGHT EYE CDE=18.58;  Surgeon: Fabio Pierce, MD;  Location: [facility];  Service: Ophthalmology;  Laterality: Right;   Patient Active Problem List   Diagnosis Date Noted   Spondylolisthesis, lumbar region 11/29/2021   Hypermagnesemia 11/29/2021   Hypokalemia 11/29/2021   Sciatica 11/07/2021   Pain of left lower leg 08/03/2021   Herpes zoster 06/02/2021   Left hemiplegia (HCC) 05/30/2021   Acute urinary tract infection 05/28/2021   Chronic low back pain 03/08/2021   Cervical radiculopathy 12/16/2020   Neck pain 12/16/2020   Impairment of balance 11/29/2020   Seborrheic keratosis 11/29/2020   Prediabetes 11/28/2020   Impaired fasting glucose 11/24/2020   CVA (cerebral vascular accident) (HCC) 06/05/2016   Hypertension 06/05/2016    PCP: Benita Stabile, MDRef Provider (PCP)   REFERRING PROVIDER:   Vickki Hearing, MD     REFERRING DIAG: M43.17 (ICD-10-CM) - Spondylolisthesis, lumbosacral region   Rationale for Evaluation and Treatment: Rehabilitation  THERAPY DIAG:  No diagnosis found.  ONSET DATE: Feb 2024 --------------------------------------------------------------------------------------------- SUBJECTIVE:                                                                                                                                                                                           SUBJECTIVE STATEMENT:  Today: Patient states she has been able to  do HEP;     Eval: Patient states she lives with husband. Her husband had hip surgery in Feb and fell in March causing him to undergo another hip surgery. Since then, patient takes care of her husband. Low back pain began late Spring 2024. Patient went to MD and prescribed medicine. Patient has been on muscle relaxer. Since July 15, patient still takes Celebrex and NSAID 2-3x a day. Patient will see her PCP this Wednesday.    PERTINENT HISTORY:  Hx CVA 2018, HTN, Osteoporosis, Spondylolisthesis  PAIN:  Are you having pain? Yes: NPRS scale: 3/10 Pain location: lumbar Pain description: dull/ache  Aggravating factors: ADLs Relieving factors: laying  PRECAUTIONS: None  RED FLAGS: None   WEIGHT BEARING RESTRICTIONS: No  FALLS:  Has patient fallen in last 6 months? Yes. Number of falls 1 in March 2024  LIVING ENVIRONMENT: Lives with: lives with their spouse Lives in: House/apartment Stairs: Yes: Internal: 1 level steps; patient does not use stairs  Has following equipment at home: None  OCCUPATION: retired  PLOF: Independent  PATIENT GOALS: To be able to walk pain free  NEXT MD VISIT: FEB 2024 --------------------------------------------------------------------------------------------- OBJECTIVE:   DIAGNOSTIC FINDINGS:  X-rays show L4-5 grade 1/2 spondylolisthesis with mild curve which may be reactive and not structural    Degenerative changes are seen in the facet joints of L4-S1   Impression grade 1/2 spondylolisthesis with mild reactive curve and facet arthritis  PATIENT SURVEYS:    SCREENING FOR RED FLAGS: Bowel or bladder incontinence: No Spinal tumors: No Cauda equina syndrome: No Compression fracture: No Abdominal aneurysm: No  COGNITION: Overall cognitive status: Within functional limits for tasks assessed  POSTURE: increased thoracic kyphosis and anterior pelvic tilt      FUNCTIONAL TESTS:  5 times sit to stand: 10.7s Timed up and go (TUG): 12.5s  SENSATION: WFL  GAIT ANALYSIS: Distance walked: 17ft Assistive device utilized: None Level of assistance: Complete Independence Comments: Patient presents with unsteadiness on feet; minimal scissoring     LUMBAR ROM:   AROM eval  Flexion To shins  Extension 5  Right lateral flexion To mid thigh  Left lateral flexion To mid thigh  Right rotation   Left rotation    (Blank rows = not tested; * = limited by pain)  LOWER EXTREMITY MMT:    MMT Right eval Left eval  Hip flexion 3+/5 3+/5  Hip extension    Hip abduction    Hip adduction    Hip internal rotation    Hip external rotation    Knee flexion 3/5 3/5  Knee extension 4-/5 4-/5  Ankle dorsiflexion 3+/5 3+/5  Ankle plantarflexion    Ankle inversion    Ankle eversion     (Blank rows = not tested)   PALPATION: Minimal tenderness to palpation through lumbar paraspinals --------------------------------------------------------------------------------------------- TODAY'S TREATMENT:  DATE:   10/08/22 Neuromuscular re-education x90min -Repeated flexion/ extension in sitting, ([place]), alternating  -Seated PB hip hinges with PT contact guard assist  -S2S with PT overpressure     10/01/22 Gait Training x54minutes - 2 pt step-to gait using  right single point cane with SBA   Therapeutic exercise x92minutes - NuStep L3, Handles #8, Seat #8  -Seated forward flexion with Blue Physioball 10x5"     10/01/22 Supine Single Knee to Chest Stretch  - 2 x daily - 5 x weekly - 5 reps - 3 seconds hold Supine Bridge with Resistance Band  - 2 x daily - 5 x weekly - 3 sets - 10 reps Clamshell with Resistance  - 2 x daily - 5 x weekly - 10 reps - 3 seconds  hold Prone Press Up On Elbows  - 2 x daily - 5 x weekly - 10 reps Quadruped Rocking Slow  - 2 x daily - 5 x weekly - 10 reps   PATIENT EDUCATION:  Education details: HEP, safety Person educated: Patient Education method: Explanation and Demonstration Education comprehension: verbalized understanding  HOME EXERCISE PROGRAM: Access Code: [email or web address] URL: [email or web address] Date: 09/26/2022 Prepared by: Seymour Bars Exercises - Supine Single Knee to Chest Stretch  - 2 x daily - 5 x weekly - 5 reps - 3 seconds hold - Supine Bridge with Resistance Band  - 2 x daily - 5 x weekly - 3 sets - 10 reps - Clamshell with Resistance  - 2 x daily - 5 x weekly - 10 reps - 3 seconds  hold - Prone Press Up On Elbows  - 2 x daily - 5 x weekly - 10 reps - Quadruped Rocking Slow  - 2 x daily - 5 x weekly - 10 reps --------------------------------------------------------------------------------------------- ASSESSMENT:  CLINICAL IMPRESSION:  Today: Patient entered PT clinic today with GOOD balance today but increased fatigue. Mid session, pt c/o minimal "haziness", PT checked BP to be 112/72. Pt took seated break. PT continued to work on lower extremity strength    ------------------------------------------------------------------------------------------------------------------ EVAL: Patient is a 82 y.o. female who was seen today for physical therapy evaluation and treatment for unsteadiness on feet and low back pain. Patient will benefit from PT to address the  limitations/impairments listed below to return to prior level of function   OBJECTIVE IMPAIRMENTS: decreased balance, decreased endurance, difficulty walking, and decreased strength.   ACTIVITY LIMITATIONS: carrying, lifting, bending, sitting, standing, squatting, stairs, and dressing  PARTICIPATION LIMITATIONS: cleaning, laundry, driving, and community activity  PERSONAL FACTORS: Age and 1 comorbidity: stroke 2018  are also affecting patient's functional outcome.   REHAB POTENTIAL: Good  CLINICAL DECISION MAKING: Stable/uncomplicated  EVALUATION COMPLEXITY: Moderate  --------------------------------------------------------------------------------------------- GOALS: Goals reviewed with patient? Yes  SHORT TERM GOALS: Target date: 10/22/2022    Patient will be able to complete the Timed Up and Go Test (TUG) within 10 seconds to improve gait speed to facilitate safe ambulation around home and community  Baseline: Goal status: INITIAL  2.  .Patient will be able to complete the 5 Times Sit To Stand Test(5TSTS) within 12 seconds to improve lower extremity strength to facilitate safe ambulation around home and community  Goal status: INITIAL  3. Patient will be independent with a basic stretching/strengthening HEP  Baseline:  Goal status: INITIAL   LONG TERM GOALS: Target date: 11/19/2022    Patient will be able to complete the Timed Up and Go Test (TUG) within 8 seconds to improve gait speed to facilitate safe ambulation  around home and community  Baseline:  Goal status: INITIAL  2.  Patient will be able to complete the 5 Times Sit To Stand Test(5TSTS) within 10 seconds to improve lower extremity strength to facilitate safe ambulation around home and community  Baseline:  Goal status: INITIAL  3.  Patient will be independent with a comprehensive strengthening HEP  Baseline:  Goal status:  INITIAL  --------------------------------------------------------------------------------------------- PLAN:  PT FREQUENCY: 2x/week  PT DURATION: 8 weeks  PLANNED INTERVENTIONS: Therapeutic exercises, Therapeutic activity, Neuromuscular re-education, Balance training, Gait training, Patient/Family education, Self Care, and Joint mobilization.  PLAN FOR NEXT SESSION: Progress patient through basic lumbar stability and lower extremity strengthening routine/ HEP. Please review HEP to confirm understanding   Seymour Bars, PT 10/08/2022, 2:36 PM

## 2022-10-10 ENCOUNTER — Ambulatory Visit (HOSPITAL_COMMUNITY): Payer: Medicare PPO

## 2022-10-10 DIAGNOSIS — R2689 Other abnormalities of gait and mobility: Secondary | ICD-10-CM | POA: Diagnosis not present

## 2022-10-10 DIAGNOSIS — M4317 Spondylolisthesis, lumbosacral region: Secondary | ICD-10-CM | POA: Diagnosis not present

## 2022-10-10 DIAGNOSIS — M5459 Other low back pain: Secondary | ICD-10-CM

## 2022-10-10 NOTE — Therapy (Signed)
[facility] OUT[facility]IENT PHYSICAL THERAPY TREATMENT NOTE   Patient Name: Kimberly Woods MRN: [id number] DOB:08-29-1940, 82 y.o., female Today's Date: 10/10/2022  END OF SESSION:  PT End of Session - 10/10/22 1002     Visit Number 6    Number of Visits 16    Date for PT Re-Evaluation 11/19/22    Authorization Type [employer] ( auth-y; no visit limit)    Authorization Time Period 12    Authorization - Number of Visits 12    Progress Note Due on Visit 10    PT Start Time [phone]    PT Stop Time 1027    PT Time Calculation (min) 40 min    Activity Tolerance Patient tolerated treatment well                 Past Medical History:  Diagnosis Date   Allergy    Blood transfusion without reported diagnosis    after childbirth   Hypercholesterolemia 2018   Hypertension    Osteoporosis    Stroke (HCC) 05/2016   Past Surgical History:  Procedure Laterality Date   APPENDECTOMY  1963   TONSILLECTOMY  1949   TUBAL LIGATION  1974   VAGINAL HYSTERECTOMY     VITRECTOMY AND CATARACT Right 12/13/2016   Procedure: ANTERIOR VITRECTOMY AND CATARACT EXTRACTION RIGHT EYE CDE=18.58;  Surgeon: Fabio Pierce, MD;  Location: [facility];  Service: Ophthalmology;  Laterality: Right;   Patient Active Problem List   Diagnosis Date Noted   Spondylolisthesis, lumbar region 11/29/2021   Hypermagnesemia 11/29/2021   Hypokalemia 11/29/2021   Sciatica 11/07/2021   Pain of left lower leg 08/03/2021   Herpes zoster 06/02/2021   Left hemiplegia (HCC) 05/30/2021   Acute urinary tract infection 05/28/2021   Chronic low back pain 03/08/2021   Cervical radiculopathy 12/16/2020   Neck pain 12/16/2020   Impairment of balance 11/29/2020   Seborrheic keratosis 11/29/2020   Prediabetes 11/28/2020   Impaired fasting glucose 11/24/2020   CVA (cerebral vascular accident) (HCC) 06/05/2016   Hypertension 06/05/2016    PCP: Benita Stabile, MDRef Provider (PCP)   REFERRING PROVIDER:   Vickki Hearing, MD     REFERRING DIAG: M43.17 (ICD-10-CM) - Spondylolisthesis, lumbosacral region   Rationale for Evaluation and Treatment: Rehabilitation  THERAPY DIAG:  Other abnormalities of gait and mobility  Other low back pain  ONSET DATE: Feb 2024 --------------------------------------------------------------------------------------------- SUBJECTIVE:                                                                                                                                                                                           SUBJECTIVE STATEMENT:  Today: Patient states she has been able to do HEP; states her HEP is going well    Eval: Patient states she lives with husband. Her husband had hip surgery in Feb and fell in March causing him to undergo another hip surgery. Since then, patient takes care of her husband. Low back pain began late Spring 2024. Patient went to MD and prescribed medicine. Patient has been on muscle relaxer. Since July 15, patient still takes Celebrex and NSAID 2-3x a day. Patient will see her PCP this Wednesday.    PERTINENT HISTORY:  Hx CVA 2018, HTN, Osteoporosis, Spondylolisthesis  PAIN:  Are you having pain? Yes: NPRS scale: 3/10 Pain location: lumbar Pain description: dull/ache  Aggravating factors: ADLs Relieving factors: laying  PRECAUTIONS: None  RED FLAGS: None   WEIGHT BEARING RESTRICTIONS: No  FALLS:  Has patient fallen in last 6 months? Yes. Number of falls 1 in March 2024  LIVING ENVIRONMENT: Lives with: lives with their spouse Lives in: House/apartment Stairs: Yes: Internal: 1 level steps; patient does not use stairs  Has following equipment at home: None  OCCUPATION: retired  PLOF: Independent  PATIENT GOALS: To be able to walk pain free  NEXT MD VISIT: FEB 2024 --------------------------------------------------------------------------------------------- OBJECTIVE:   DIAGNOSTIC FINDINGS:  X-rays show L4-5 grade 1/2  spondylolisthesis with mild curve which may be reactive and not structural   Degenerative changes are seen in the facet joints of L4-S1   Impression grade 1/2 spondylolisthesis with mild reactive curve and facet arthritis  PATIENT SURVEYS:    SCREENING FOR RED FLAGS: Bowel or bladder incontinence: No Spinal tumors: No Cauda equina syndrome: No Compression fracture: No Abdominal aneurysm: No  COGNITION: Overall cognitive status: Within functional limits for tasks assessed  POSTURE: increased thoracic kyphosis and anterior pelvic tilt      FUNCTIONAL TESTS:  5 times sit to stand: 10.7s Timed up and go (TUG): 12.5s  SENSATION: WFL  GAIT ANALYSIS: Distance walked: 14ft Assistive device utilized: None Level of assistance: Complete Independence Comments: Patient presents with unsteadiness on feet; minimal scissoring     LUMBAR ROM:   AROM eval  Flexion To shins  Extension 5  Right lateral flexion To mid thigh  Left lateral flexion To mid thigh  Right rotation   Left rotation    (Blank rows = not tested; * = limited by pain)  LOWER EXTREMITY MMT:    MMT Right eval Left eval  Hip flexion 3+/5 3+/5  Hip extension    Hip abduction    Hip adduction    Hip internal rotation    Hip external rotation    Knee flexion 3/5 3/5  Knee extension 4-/5 4-/5  Ankle dorsiflexion 3+/5 3+/5  Ankle plantarflexion    Ankle inversion    Ankle eversion     (Blank rows = not tested)   PALPATION: Minimal tenderness to palpation through lumbar paraspinals --------------------------------------------------------------------------------------------- TODAY'S TREATMENT:  DATE:   10/10/22 Therapeutic exercise x9min -NuStep L3 Seat 8, handle 8 warm up - Supine Single Knee to Chest Stretch  - 10 reps - 3 seconds hold - Supine March with Resistance Band  -  10 reps - 3" hold - Clamshell with Resistance  - 10 reps - 3 seconds  hold - Prone Press Up On Elbows  - 10 reps    10/08/22 Neuromuscular re-education x75min -Repeated flexion/ extension in sitting, (McKenzie), alternating  -Seated PB hip hinges with PT contact guard assist  -S2S with PT overpressure     10/01/22 Gait Training x62minutes - 2 pt step-to gait using right single point cane with SBA  Therapeutic exercise x20minutes - NuStep L3, Handles #8, Seat #8  -Seated forward flexion with Blue Physioball 10x5"     10/01/22 Supine Single Knee to Chest Stretch  - 2 x daily - 5 x weekly - 5 reps - 3 seconds hold Supine Bridge with Resistance Band  - 2 x daily - 5 x weekly - 3 sets - 10 reps Clamshell with Resistance  - 2 x daily - 5 x weekly - 10 reps - 3 seconds  hold Prone Press Up On Elbows  - 2 x daily - 5 x weekly - 10 reps Quadruped Rocking Slow  - 2 x daily - 5 x weekly - 10 reps   PATIENT EDUCATION:  Education details: HEP, safety Person educated: Patient Education method: Explanation and Demonstration Education comprehension: verbalized understanding  HOME EXERCISE PROGRAM: Access Code: [email or web address] URL: [email or web address] Date: 10/10/2022 Prepared by: Seymour Bars Exercises - Supine Single Knee to Chest Stretch  - 2 x daily - 5 x weekly - 10 reps - 3 seconds hold - Supine March with Resistance Band  - 2 x daily - 5 x weekly - 10 reps - 3" hold(added 8/29) - Clamshell with Resistance  - 2 x daily - 5 x weekly - 10 reps - 3 seconds  hold - Prone Press Up On Elbows  - 2 x daily - 5 x weekly - 10 reps ASSESSMENT:  CLINICAL IMPRESSION:  Today: Patient received in waiting room today; able to walk to PT gym independently. PT session focused on updating/ reviewing HEP   ------------------------------------------------------------------------------------------------------------------ EVAL: Patient is a 82 y.o. female who was seen today for physical  therapy evaluation and treatment for unsteadiness on feet and low back pain. Patient will benefit from PT to address the limitations/impairments listed below to return to prior level of function   OBJECTIVE IMPAIRMENTS: decreased balance, decreased endurance, difficulty walking, and decreased strength.   ACTIVITY LIMITATIONS: carrying, lifting, bending, sitting, standing, squatting, stairs, and dressing  PARTICIPATION LIMITATIONS: cleaning, laundry, driving, and community activity  PERSONAL FACTORS: Age and 1 comorbidity: stroke 2018  are also affecting patient's functional outcome.   REHAB POTENTIAL: Good  CLINICAL DECISION MAKING: Stable/uncomplicated  EVALUATION COMPLEXITY: Moderate  --------------------------------------------------------------------------------------------- GOALS: Goals reviewed with patient? Yes  SHORT TERM GOALS: Target date: 10/22/2022    Patient will be able to complete the Timed Up and Go Test (TUG) within 10 seconds to improve gait speed to facilitate safe ambulation around home and community  Baseline: Goal status: INITIAL  2.  .Patient will be able to complete the 5 Times Sit To Stand Test(5TSTS) within 12 seconds to improve lower extremity strength to facilitate safe ambulation around home and community  Goal status: INITIAL  3. Patient will be independent with a basic stretching/strengthening HEP  Baseline:  Goal status: INITIAL   LONG  TERM GOALS: Target date: 11/19/2022    Patient will be able to complete the Timed Up and Go Test (TUG) within 8 seconds to improve gait speed to facilitate safe ambulation around home and community  Baseline:  Goal status: INITIAL  2.  Patient will be able to complete the 5 Times Sit To Stand Test(5TSTS) within 10 seconds to improve lower extremity strength to facilitate safe ambulation around home and community  Baseline:  Goal status: INITIAL  3.  Patient will be independent with a comprehensive  strengthening HEP  Baseline:  Goal status: INITIAL  --------------------------------------------------------------------------------------------- PLAN:  PT FREQUENCY: 2x/week  PT DURATION: 8 weeks  PLANNED INTERVENTIONS: Therapeutic exercises, Therapeutic activity, Neuromuscular re-education, Balance training, Gait training, Patient/Family education, Self Care, and Joint mobilization.  PLAN FOR NEXT SESSION: Progress patient through basic lumbar stability and lower extremity strengthening routine/ HEP. Please review HEP to confirm understanding   Seymour Bars, PT 10/10/2022, 10:03 AM

## 2022-10-15 ENCOUNTER — Encounter (HOSPITAL_COMMUNITY): Payer: Medicare PPO

## 2022-10-15 NOTE — Therapy (Deleted)
[facility] PHYSICAL THERAPY TREATMENT NOTE   Patient Name: Kimberly Woods MRN: [id number] DOB:28-Feb-1940, 82 y.o., female Today's Date: 10/15/2022  END OF SESSION:        Past Medical History:  Diagnosis Date   Allergy    Blood transfusion without reported diagnosis    after childbirth   Hypercholesterolemia 2018   Hypertension    Osteoporosis    Stroke [facility]) 05/2016   Past Surgical History:  Procedure Laterality Date   APPENDECTOMY  1963   TONSILLECTOMY  1949   TUBAL LIGATION  1974   VAGINAL HYSTERECTOMY     VITRECTOMY AND CATARACT Right 12/13/2016   Procedure: ANTERIOR VITRECTOMY AND CATARACT EXTRACTION RIGHT EYE CDE=18.58;  Surgeon: Fabio Pierce, MD;  Location: [facility];  Service: Ophthalmology;  Laterality: Right;   Patient Active Problem List   Diagnosis Date Noted   Spondylolisthesis, lumbar region 11/29/2021   Hypermagnesemia 11/29/2021   Hypokalemia 11/29/2021   Sciatica 11/07/2021   Pain of left lower leg 08/03/2021   Herpes zoster 06/02/2021   Left hemiplegia (HCC) 05/30/2021   Acute urinary tract infection 05/28/2021   Chronic low back pain 03/08/2021   Cervical radiculopathy 12/16/2020   Neck pain 12/16/2020   Impairment of balance 11/29/2020   Seborrheic keratosis 11/29/2020   Prediabetes 11/28/2020   Impaired fasting glucose 11/24/2020   CVA (cerebral vascular accident) (HCC) 06/05/2016   Hypertension 06/05/2016    PCP: Benita Stabile, MDRef Provider (PCP)   REFERRING PROVIDER:   Vickki Hearing, MD    REFERRING DIAG: M43.17 (ICD-10-CM) - Spondylolisthesis, lumbosacral region   Rationale for Evaluation and Treatment: Rehabilitation  THERAPY DIAG:  No diagnosis found.  ONSET DATE: Feb 2024 --------------------------------------------------------------------------------------------- SUBJECTIVE:                                                                                                                                                                                            SUBJECTIVE STATEMENT:  Today: Patient states she has been able to do HEP; states her HEP is going well    Eval: Patient states she lives with husband. Her husband had hip surgery in Feb and fell in March causing him to undergo another hip surgery. Since then, patient takes care of her husband. Low back pain began late Spring 2024. Patient went to MD and prescribed medicine. Patient has been on muscle relaxer. Since July 15, patient still takes Celebrex and NSAID 2-3x a day. Patient will see her PCP this Wednesday.    PERTINENT HISTORY:  Hx CVA 2018, HTN, Osteoporosis, Spondylolisthesis  PAIN:  Are you having pain? Yes: NPRS scale: 3/10 Pain location:  lumbar Pain description: dull/ache  Aggravating factors: ADLs Relieving factors: laying  PRECAUTIONS: None  RED FLAGS: None   WEIGHT BEARING RESTRICTIONS: No  FALLS:  Has patient fallen in last 6 months? Yes. Number of falls 1 in March 2024  LIVING ENVIRONMENT: Lives with: lives with their spouse Lives in: House/apartment Stairs: Yes: Internal: 1 level steps; patient does not use stairs  Has following equipment at home: None  OCCUPATION: retired  PLOF: Independent  PATIENT GOALS: To be able to walk pain free  NEXT MD VISIT: FEB 2024 --------------------------------------------------------------------------------------------- OBJECTIVE:   DIAGNOSTIC FINDINGS:  X-rays show L4-5 grade 1/2 spondylolisthesis with mild curve which may be reactive and not structural   Degenerative changes are seen in the facet joints of L4-S1   Impression grade 1/2 spondylolisthesis with mild reactive curve and facet arthritis  PATIENT SURVEYS:    SCREENING FOR RED FLAGS: Bowel or bladder incontinence: No Spinal tumors: No Cauda equina syndrome: No Compression fracture: No Abdominal aneurysm: No  COGNITION: Overall cognitive status: Within functional limits for tasks  assessed  POSTURE: increased thoracic kyphosis and anterior pelvic tilt      FUNCTIONAL TESTS:  5 times sit to stand: 10.7s Timed up and go (TUG): 12.5s  SENSATION: WFL  GAIT ANALYSIS: Distance walked: 73ft Assistive device utilized: None Level of assistance: Complete Independence Comments: Patient presents with unsteadiness on feet; minimal scissoring     LUMBAR ROM:   AROM eval  Flexion To shins  Extension 5  Right lateral flexion To mid thigh  Left lateral flexion To mid thigh  Right rotation   Left rotation    (Blank rows = not tested; * = limited by pain)  LOWER EXTREMITY MMT:    MMT Right eval Left eval  Hip flexion 3+/5 3+/5  Hip extension    Hip abduction    Hip adduction    Hip internal rotation    Hip external rotation    Knee flexion 3/5 3/5  Knee extension 4-/5 4-/5  Ankle dorsiflexion 3+/5 3+/5  Ankle plantarflexion    Ankle inversion    Ankle eversion     (Blank rows = not tested)   PALPATION: Minimal tenderness to palpation through lumbar paraspinals --------------------------------------------------------------------------------------------- TODAY'S TREATMENT:                                                                                                                              DATE:   10/10/22 Therapeutic exercise x110min -NuStep L3 Seat 8, handle 8 warm up - Supine Single Knee to Chest Stretch  - 10 reps - 3 seconds hold - Supine March with Resistance Band  - 10 reps - 3" hold - Clamshell with Resistance  - 10 reps - 3 seconds  hold - Prone Press Up On Elbows  - 10 reps    10/08/22 Neuromuscular re-education x41min -Repeated flexion/ extension in sitting, (McKenzie), alternating  -Seated PB hip hinges with PT contact guard assist  -S2S  with PT overpressure     10/01/22 Gait Training x46minutes - 2 pt step-to gait using right single point cane with SBA  Therapeutic exercise x85minutes - NuStep L3, Handles #8, Seat #8   -Seated forward flexion with Blue Physioball 10x5"     10/01/22 Supine Single Knee to Chest Stretch  - 2 x daily - 5 x weekly - 5 reps - 3 seconds hold Supine Bridge with Resistance Band  - 2 x daily - 5 x weekly - 3 sets - 10 reps Clamshell with Resistance  - 2 x daily - 5 x weekly - 10 reps - 3 seconds  hold Prone Press Up On Elbows  - 2 x daily - 5 x weekly - 10 reps Quadruped Rocking Slow  - 2 x daily - 5 x weekly - 10 reps   PATIENT EDUCATION:  Education details: HEP, safety Person educated: Patient Education method: Explanation and Demonstration Education comprehension: verbalized understanding  HOME EXERCISE PROGRAM: Access Code: [email or web address] URL: [email or web address] Date: 10/10/2022 Prepared by: Seymour Bars Exercises - Supine Single Knee to Chest Stretch  - 2 x daily - 5 x weekly - 10 reps - 3 seconds hold - Supine March with Resistance Band  - 2 x daily - 5 x weekly - 10 reps - 3" hold(added 8/29) - Clamshell with Resistance  - 2 x daily - 5 x weekly - 10 reps - 3 seconds  hold - Prone Press Up On Elbows  - 2 x daily - 5 x weekly - 10 reps ASSESSMENT:  CLINICAL IMPRESSION:  Today: Patient received in waiting room today; able to walk to PT gym independently. PT session focused on updating/ reviewing HEP   ------------------------------------------------------------------------------------------------------------------ EVAL: Patient is a 82 y.o. female who was seen today for physical therapy evaluation and treatment for unsteadiness on feet and low back pain. Patient will benefit from PT to address the limitations/impairments listed below to return to prior level of function   OBJECTIVE IMPAIRMENTS: decreased balance, decreased endurance, difficulty walking, and decreased strength.   ACTIVITY LIMITATIONS: carrying, lifting, bending, sitting, standing, squatting, stairs, and dressing  PARTICIPATION LIMITATIONS: cleaning, laundry, driving, and community  activity  PERSONAL FACTORS: Age and 1 comorbidity: stroke 2018  are also affecting patient's functional outcome.   REHAB POTENTIAL: Good  CLINICAL DECISION MAKING: Stable/uncomplicated  EVALUATION COMPLEXITY: Moderate  --------------------------------------------------------------------------------------------- GOALS: Goals reviewed with patient? Yes  SHORT TERM GOALS: Target date: 10/22/2022    Patient will be able to complete the Timed Up and Go Test (TUG) within 10 seconds to improve gait speed to facilitate safe ambulation around home and community  Baseline: Goal status: INITIAL  2.  .Patient will be able to complete the 5 Times Sit To Stand Test(5TSTS) within 12 seconds to improve lower extremity strength to facilitate safe ambulation around home and community  Goal status: INITIAL  3. Patient will be independent with a basic stretching/strengthening HEP  Baseline:  Goal status: INITIAL   LONG TERM GOALS: Target date: 11/19/2022    Patient will be able to complete the Timed Up and Go Test (TUG) within 8 seconds to improve gait speed to facilitate safe ambulation around home and community  Baseline:  Goal status: INITIAL  2.  Patient will be able to complete the 5 Times Sit To Stand Test(5TSTS) within 10 seconds to improve lower extremity strength to facilitate safe ambulation around home and community  Baseline:  Goal status: INITIAL  3.  Patient will be independent with a comprehensive strengthening HEP  Baseline:  Goal status: INITIAL  --------------------------------------------------------------------------------------------- PLAN:  PT FREQUENCY: 2x/week  PT DURATION: 8 weeks  PLANNED INTERVENTIONS: Therapeutic exercises, Therapeutic activity, Neuromuscular re-education, Balance training, Gait training, Patient/Family education, Self Care, and Joint mobilization.  PLAN FOR NEXT SESSION: Progress patient through basic lumbar stability and lower extremity  strengthening routine/ HEP. Please review HEP to confirm understanding   Seymour Bars, PT 10/15/2022, 8:11 AM

## 2022-10-17 ENCOUNTER — Ambulatory Visit (HOSPITAL_COMMUNITY): Payer: Medicare PPO | Attending: Orthopedic Surgery

## 2022-10-17 DIAGNOSIS — R2689 Other abnormalities of gait and mobility: Secondary | ICD-10-CM | POA: Insufficient documentation

## 2022-10-17 DIAGNOSIS — M5459 Other low back pain: Secondary | ICD-10-CM | POA: Insufficient documentation

## 2022-10-17 NOTE — Therapy (Signed)
[facility] OUT[facility]IENT PHYSICAL THERAPY TREATMENT NOTE   Patient Name: Kimberly Woods MRN: [id number] DOB:[birth year], 82 y.o., female Today's Date: 10/17/2022  END OF SESSION:  **RE- AUTH NEXT VISIT   PT End of Session - 10/17/22 0949     Visit Number 7    Number of Visits 16    Date for PT Re-Evaluation 11/19/22    Authorization Type [employer] (8 approved until 11/04/22)    Authorization Time Period 8    Authorization - Number of Visits 8    Progress Note Due on Visit 8    PT Start Time 0945    PT Stop Time 1025    PT Time Calculation (min) 40 min    Activity Tolerance Patient tolerated treatment well                  Past Medical History:  Diagnosis Date   Allergy    Blood transfusion without reported diagnosis    after childbirth   Hypercholesterolemia 2018   Hypertension    Osteoporosis    Stroke (HCC) 05/2016   Past Surgical History:  Procedure Laterality Date   APPENDECTOMY  1963   TONSILLECTOMY  1949   TUBAL LIGATION  1974   VAGINAL HYSTERECTOMY     VITRECTOMY AND CATARACT Right 12/13/2016   Procedure: ANTERIOR VITRECTOMY AND CATARACT EXTRACTION RIGHT EYE CDE=18.58;  Surgeon: Fabio Pierce, MD;  Location: [facility];  Service: Ophthalmology;  Laterality: Right;   Patient Active Problem List   Diagnosis Date Noted   Spondylolisthesis, lumbar region 11/29/2021   Hypermagnesemia 11/29/2021   Hypokalemia 11/29/2021   Sciatica 11/07/2021   Pain of left lower leg 08/03/2021   Herpes zoster 06/02/2021   Left hemiplegia (HCC) 05/30/2021   Acute urinary tract infection 05/28/2021   Chronic low back pain 03/08/2021   Cervical radiculopathy 12/16/2020   Neck pain 12/16/2020   Impairment of balance 11/29/2020   Seborrheic keratosis 11/29/2020   Prediabetes 11/28/2020   Impaired fasting glucose 11/24/2020   CVA (cerebral vascular accident) (HCC) 06/05/2016   Hypertension 06/05/2016    PCP: Benita Stabile, MDRef Provider (PCP)   REFERRING PROVIDER:    Vickki Hearing, MD    REFERRING DIAG: M43.17 (ICD-10-CM) - Spondylolisthesis, lumbosacral region   Rationale for Evaluation and Treatment: Rehabilitation  THERAPY DIAG:  Other abnormalities of gait and mobility  Other low back pain  ONSET DATE: Feb 2024 --------------------------------------------------------------------------------------------- SUBJECTIVE:  SUBJECTIVE STATEMENT:  Today: Patient states she has been in more pain over the last two days    Eval: Patient states she lives with husband. Her husband had hip surgery in Feb and fell in March causing him to undergo another hip surgery. Since then, patient takes care of her husband. Low back pain began late Spring 2024. Patient went to MD and prescribed medicine. Patient has been on muscle relaxer. Since July 15, patient still takes Celebrex and NSAID 2-3x a day. Patient will see her PCP this Wednesday.    PERTINENT HISTORY:  Hx CVA 2018, HTN, Osteoporosis, Spondylolisthesis  PAIN:  Are you having pain? Yes: NPRS scale: 3/10 Pain location: lumbar Pain description: dull/ache  Aggravating factors: ADLs Relieving factors: laying  PRECAUTIONS: None  RED FLAGS: None   WEIGHT BEARING RESTRICTIONS: No  FALLS:  Has patient fallen in last 6 months? Yes. Number of falls 1 in March 2024  LIVING ENVIRONMENT: Lives with: lives with their spouse Lives in: House/apartment Stairs: Yes: Internal: 1 level steps; patient does not use stairs  Has following equipment at home: None  OCCUPATION: retired  PLOF: Independent  PATIENT GOALS: To be able to walk pain free  NEXT MD VISIT: FEB 2024 --------------------------------------------------------------------------------------------- OBJECTIVE:   DIAGNOSTIC FINDINGS:  X-rays show L4-5  grade 1/2 spondylolisthesis with mild curve which may be reactive and not structural   Degenerative changes are seen in the facet joints of L4-S1   Impression grade 1/2 spondylolisthesis with mild reactive curve and facet arthritis  PATIENT SURVEYS:    SCREENING FOR RED FLAGS: Bowel or bladder incontinence: No Spinal tumors: No Cauda equina syndrome: No Compression fracture: No Abdominal aneurysm: No  COGNITION: Overall cognitive status: Within functional limits for tasks assessed  POSTURE: increased thoracic kyphosis and anterior pelvic tilt      FUNCTIONAL TESTS:  5 times sit to stand: 10.7s Timed up and go (TUG): 12.5s  SENSATION: WFL  GAIT ANALYSIS: Distance walked: 54ft Assistive device utilized: None Level of assistance: Complete Independence Comments: Patient presents with unsteadiness on feet; minimal scissoring     LUMBAR ROM:   AROM eval  Flexion To shins  Extension 5  Right lateral flexion To mid thigh  Left lateral flexion To mid thigh  Right rotation   Left rotation    (Blank rows = not tested; * = limited by pain)  LOWER EXTREMITY MMT:    MMT Right eval Left eval  Hip flexion 3+/5 3+/5  Hip extension    Hip abduction    Hip adduction    Hip internal rotation    Hip external rotation    Knee flexion 3/5 3/5  Knee extension 4-/5 4-/5  Ankle dorsiflexion 3+/5 3+/5  Ankle plantarflexion    Ankle inversion    Ankle eversion     (Blank rows = not tested)   PALPATION: Minimal tenderness to palpation through lumbar paraspinals --------------------------------------------------------------------------------------------- TODAY'S TREATMENT:  DATE:   10/17/22 Therapeutic exercise x40' -Supine Single Knee to Chest Stretch  10 reps - 3 seconds hold -Supine March with Resistance Band  10 reps -Hooklying Isometric Hip  Flexion  10 reps - 3 secs hold -Supine Bridge with Mini Swiss Ball Between Knees 10 reps - 3 secs hold -Clamshell with Resistance 10 reps - 3 seconds  hold   10/10/22 Therapeutic exercise x62min -NuStep L3 Seat 8, handle 8 warm up - Supine Single Knee to Chest Stretch  - 10 reps - 3 seconds hold - Supine March with Resistance Band  - 10 reps - 3" hold - Clamshell with Resistance  - 10 reps - 3 seconds  hold - Prone Press Up On Elbows  - 10 reps    10/08/22 Neuromuscular re-education x63min -Repeated flexion/ extension in sitting, ([place]enzie), alternating  -Seated PB hip hinges with PT contact guard assist  -S2S with PT overpressure     10/01/22 Gait Training x31minutes - 2 pt step-to gait using right single point cane with SBA  Therapeutic exercise x27minutes - NuStep L3, Handles #8, Seat #8  -Seated forward flexion with Blue Physioball 10x5"    PATIENT EDUCATION:  Education details: HEP, safety Person educated: Patient Education method: Explanation and Demonstration Education comprehension: verbalized understanding  HOME EXERCISE PROGRAM: Access Code: [email or web address] URL: [email or web address] Date: 10/17/2022 Prepared by: Seymour Bars  Exercises - Supine Single Knee to Chest Stretch  - 2 x daily - 5 x weekly - 10 reps - 3 seconds hold - Supine March with Resistance Band  - 2 x daily - 7 x weekly - 10 reps - Hooklying Isometric Hip Flexion  - 2 x daily - 7 x weekly - 10 reps - 3 secs hold - Supine Bridge with Mini Swiss Ball Between Knees  - 2 x daily - 7 x weekly - 10 reps - 3 secs hold - Clamshell with Resistance  - 2 x daily - 5 x weekly - 10 reps - 3 seconds  hold   ASSESSMENT:  CLINICAL IMPRESSION:  Today: Patient received in waiting room today; able to walk to PT gym independently. PT session focused on updating/ reviewing HEP, changed frequency to PT 1x/ week because pt is compliant with  HEP  ------------------------------------------------------------------------------------------------------------------ EVAL: Patient is a 82 y.o. female who was seen today for physical therapy evaluation and treatment for unsteadiness on feet and low back pain. Patient will benefit from PT to address the limitations/impairments listed below to return to prior level of function   OBJECTIVE IMPAIRMENTS: decreased balance, decreased endurance, difficulty walking, and decreased strength.   ACTIVITY LIMITATIONS: carrying, lifting, bending, sitting, standing, squatting, stairs, and dressing  PARTICIPATION LIMITATIONS: cleaning, laundry, driving, and community activity  PERSONAL FACTORS: Age and 1 comorbidity: stroke 2018  are also affecting patient's functional outcome.   REHAB POTENTIAL: Good  CLINICAL DECISION MAKING: Stable/uncomplicated  EVALUATION COMPLEXITY: Moderate  --------------------------------------------------------------------------------------------- GOALS: Goals reviewed with patient? Yes  SHORT TERM GOALS: Target date: 10/22/2022    Patient will be able to complete the Timed Up and Go Test (TUG) within 10 seconds to improve gait speed to facilitate safe ambulation around home and community  Baseline: Goal status: INITIAL  2.  .Patient will be able to complete the 5 Times Sit To Stand Test(5TSTS) within 12 seconds to improve lower extremity strength to facilitate safe ambulation around home and community  Goal status: INITIAL  3. Patient will be independent with a basic stretching/strengthening HEP  Baseline:  Goal status: INITIAL  LONG TERM GOALS: Target date: 11/19/2022    Patient will be able to complete the Timed Up and Go Test (TUG) within 8 seconds to improve gait speed to facilitate safe ambulation around home and community  Baseline:  Goal status: INITIAL  2.  Patient will be able to complete the 5 Times Sit To Stand Test(5TSTS) within 10 seconds to  improve lower extremity strength to facilitate safe ambulation around home and community  Baseline:  Goal status: INITIAL  3.  Patient will be independent with a comprehensive strengthening HEP  Baseline:  Goal status: INITIAL  --------------------------------------------------------------------------------------------- PLAN:  PT FREQUENCY: 2x/week  PT DURATION: 8 weeks  PLANNED INTERVENTIONS: Therapeutic exercises, Therapeutic activity, Neuromuscular re-education, Balance training, Gait training, Patient/Family education, Self Care, and Joint mobilization.  PLAN FOR NEXT SESSION: Progress patient through basic lumbar stability and lower extremity strengthening routine/ HEP. Please review HEP to confirm understanding   Seymour Bars, PT 10/17/2022, 10:18 AM

## 2022-10-22 ENCOUNTER — Encounter (HOSPITAL_COMMUNITY): Payer: Medicare PPO

## 2022-10-24 ENCOUNTER — Ambulatory Visit (HOSPITAL_COMMUNITY): Payer: Medicare PPO

## 2022-10-24 DIAGNOSIS — R2689 Other abnormalities of gait and mobility: Secondary | ICD-10-CM | POA: Diagnosis not present

## 2022-10-24 DIAGNOSIS — M5459 Other low back pain: Secondary | ICD-10-CM

## 2022-10-24 NOTE — Therapy (Addendum)
[facility] PHYS[facility]ER[facility]Y PROGRESS NOTE   Patient Name: Kimberly Woods MRN: [id number] DOB:1940/08/01, 82 y.o., female Today's Date: 10/24/2022  END OF SESSION:    PT End of Session - 10/24/22 0953     Visit Number 8    Number of Visits 16    Date for PT Re-Evaluation 11/19/22    Authorization Type [employer] CHO    Authorization Time Period Auth requetesd for 8 visits on 10/24/22    Authorization - Number of Visits --    Progress Note Due on Visit --    PT Start Time 0945    PT Stop Time 1025    PT Time Calculation (min) 40 min    Activity Tolerance Patient tolerated treatment well                   Past Medical History:  Diagnosis Date   Allergy    Blood transfusion without reported diagnosis    after childbirth   Hypercholesterolemia 2018   Hypertension    Osteoporosis    Stroke (HCC) 05/2016   Past Surgical History:  Procedure Laterality Date   APPENDECTOMY  1963   TONSILLECTOMY  1949   TUBAL LIGATION  1974   VAGINAL HYSTERECTOMY     VITRECTOMY AND CATARACT Right 12/13/2016   Procedure: ANTERIOR VITRECTOMY AND CATARACT EXTRACTION RIGHT EYE CDE=18.58;  Surgeon: Fabio Pierce, MD;  Location: [facility];  Service: Ophthalmology;  Laterality: Right;   Patient Active Problem List   Diagnosis Date Noted   Spondylolisthesis, lumbar region 11/29/2021   Hypermagnesemia 11/29/2021   Hypokalemia 11/29/2021   Sciatica 11/07/2021   Pain of left lower leg 08/03/2021   Herpes zoster 06/02/2021   Left hemiplegia (HCC) 05/30/2021   Acute urinary tract infection 05/28/2021   Chronic low back pain 03/08/2021   Cervical radiculopathy 12/16/2020   Neck pain 12/16/2020   Impairment of balance 11/29/2020   Seborrheic keratosis 11/29/2020   Prediabetes 11/28/2020   Impaired fasting glucose 11/24/2020   CVA (cerebral vascular accident) (HCC) 06/05/2016   Hypertension 06/05/2016    PCP: Benita Stabile, MDRef Provider (PCP)   REFERRING PROVIDER:    Vickki Hearing, MD    REFERRING DIAG: M43.17 (ICD-10-CM) - Spondylolisthesis, lumbosacral region   Rationale for Evaluation and Treatment: Rehabilitation  THERAPY DIAG:  No diagnosis found.  ONSET DATE: Feb 2024 --------------------------------------------------------------------------------------------- SUBJECTIVE:                                                                                                                                                                                           SUBJECTIVE STATEMENT:  In the  last week: Patient states she feels "okay"; She states it seems like her LBP alternates one day where it hurts and the next day less pain. Patient still has troubles caring for her husband at home due to low back pain.     Eval: Patient states she lives with husband. Her husband had hip surgery in Feb and fell in March causing him to undergo another hip surgery. Since then, patient takes care of her husband. Low back pain began late Spring 2024. Patient went to MD and prescribed medicine. Patient has been on muscle relaxer. Since July 15, patient still takes Celebrex and NSAID 2-3x a day. Patient will see her PCP this Wednesday.    PERTINENT HISTORY:  Hx CVA 2018, HTN, Osteoporosis, Spondylolisthesis  PAIN:  Are you having pain? Yes: NPRS scale: 4/10 Pain location: lumbar Pain description: dull/ache  Aggravating factors: ADLs Relieving factors: laying  PRECAUTIONS: None  RED FLAGS: None   WEIGHT BEARING RESTRICTIONS: No  FALLS:  Has patient fallen in last 6 months? Yes. Number of falls 1 in March 2024  LIVING ENVIRONMENT: Lives with: lives with their spouse Lives in: House/apartment Stairs: Yes: Internal: 1 level steps; patient does not use stairs  Has following equipment at home: None  OCCUPATION: retired  PLOF: Independent  PATIENT GOALS: To be able to walk pain free  NEXT MD VISIT: FEB  2024 --------------------------------------------------------------------------------------------- OBJECTIVE:   DIAGNOSTIC FINDINGS:  X-rays show L4-5 grade 1/2 spondylolisthesis with mild curve which may be reactive and not structural   Degenerative changes are seen in the facet joints of L4-S1   Impression grade 1/2 spondylolisthesis with mild reactive curve and facet arthritis   SCREENING FOR RED FLAGS: Bowel or bladder incontinence: No Spinal tumors: No Cauda equina syndrome: No Compression fracture: No Abdominal aneurysm: No  COGNITION: Overall cognitive status: Within functional limits for tasks assessed  POSTURE: increased thoracic kyphosis and anterior pelvic tilt      FUNCTIONAL TESTS:  5 times sit to stand: 10.7s Timed up and go (TUG): 12.5s  10/24/22 Progress Note 5 times sit to stand: 8.73s Timed up and go (TUG): 10.84s  SENSATION: WFL  GAIT ANALYSIS: Distance walked: 40ft Assistive device utilized: None Level of assistance: Complete Independence Comments: Patient presents with unsteadiness on feet; minimal scissoring     LUMBAR ROM:   AROM eval 10/24/22  Flexion To shins Mid shin  Extension 5 10  Right lateral flexion To mid thigh WFL  Left lateral flexion To mid thigh WFL  Right rotation    Left rotation     (Blank rows = not tested; * = limited by pain)  LOWER EXTREMITY MMT:    MMT Right eval Left eval Right  10/24/22 Left 10/24/22  Hip flexion 3+/5 3+/5 4-/5 4-/5  Hip extension      Hip abduction      Hip adduction      Hip internal rotation      Hip external rotation      Knee flexion 3/5 3/5 3+/5 3+/5  Knee extension 4-/5 4-/5 4-/5 4-/5  Ankle dorsiflexion 3+/5 3+/5 4-/5 4-/5  Ankle plantarflexion      Ankle inversion      Ankle eversion       (Blank rows = not tested)   PALPATION: Minimal tenderness to palpation through lumbar  paraspinals --------------------------------------------------------------------------------------------- TODAY'S TREATMENT:  DATE:   10/24/22 Therapeutic exercise x36min -Progress note objective testing -NuStep L3 Seat 8, handle 8 warm up -Leg Curl machine with 40# 2x10 -Marches with theraband shoulder ext      10/17/22 Therapeutic exercise x40' -Supine Single Knee to Chest Stretch  10 reps - 3 seconds hold -Supine March with Resistance Band  10 reps -Hooklying Isometric Hip Flexion  10 reps - 3 secs hold -Supine Bridge with Mini Swiss Ball Between Knees 10 reps - 3 secs hold -Clamshell with Resistance 10 reps - 3 seconds  hold   10/10/22 Therapeutic exercise x90min -NuStep L3 Seat 8, handle 8 warm up - Supine Single Knee to Chest Stretch  - 10 reps - 3 seconds hold - Supine March with Resistance Band  - 10 reps - 3" hold - Clamshell with Resistance  - 10 reps - 3 seconds  hold - Prone Press Up On Elbows  - 10 reps   PATIENT EDUCATION:  Education details: HEP, safety Person educated: Patient Education method: Explanation and Demonstration Education comprehension: verbalized understanding  HOME EXERCISE PROGRAM: Access Code: [email or web address] URL: [email or web address] Date: 10/17/2022 Prepared by: Seymour Bars  Exercises - Supine Single Knee to Chest Stretch  - 2 x daily - 5 x weekly - 10 reps - 3 seconds hold - Supine March with Resistance Band  - 2 x daily - 7 x weekly - 10 reps - Hooklying Isometric Hip Flexion  - 2 x daily - 7 x weekly - 10 reps - 3 secs hold - Supine Bridge with Mini Swiss Ball Between Knees  - 2 x daily - 7 x weekly - 10 reps - 3 secs hold - Clamshell with Resistance  - 2 x daily - 5 x weekly - 10 reps - 3 seconds  hold   ASSESSMENT:  CLINICAL IMPRESSION:  Today: PT performed progress note today. Patient has  improved in overall lower extremity strength by 1/3 grade, improved both functional tests by ~2seconds, and overall decreased pain levels. Patient still presents to PT with minimal LBP, decreased endurance/ strength,  ------------------------------------------------------------------------------------------------------------------ EVAL: Patient is a 82 y.o. female who was seen today for physical therapy evaluation and treatment for unsteadiness on feet and low back pain. Patient will benefit from PT to address the limitations/impairments listed below to return to prior level of function   OBJECTIVE IMPAIRMENTS: decreased balance, decreased endurance, difficulty walking, and decreased strength.   ACTIVITY LIMITATIONS: carrying, lifting, bending, sitting, standing, squatting, stairs, and dressing  PARTICIPATION LIMITATIONS: cleaning, laundry, driving, and community activity  PERSONAL FACTORS: Age and 1 comorbidity: stroke 2018  are also affecting patient's functional outcome.   REHAB POTENTIAL: Good  CLINICAL DECISION MAKING: Stable/uncomplicated  EVALUATION COMPLEXITY: Moderate  --------------------------------------------------------------------------------------------- GOALS: Goals reviewed with patient? Yes  SHORT TERM GOALS: Target date: 10/22/2022   Patient will be able to complete the Timed Up and Go Test (TUG) within 10 seconds to improve gait speed to facilitate safe ambulation around home and community  Baseline: Goal status: 10.84s 10/24/22- in progress  2.  .Patient will be able to complete the 5 Times Sit To Stand Test(5TSTS) within 12 seconds to improve lower extremity strength to facilitate safe ambulation around home and community  Goal status: COMPLETED 10/24/22  3. Patient will be independent with a basic stretching/strengthening HEP  Baseline:  Goal status: COMPLETED 10/24/22   LONG TERM GOALS: Target date: 11/19/2022    Patient will be able to complete the Timed  Up and Go Test (TUG) within 8 seconds to  improve gait speed to facilitate safe ambulation around home and community  Baseline:  Goal status: in progress  2.  Patient will be able to complete the 5 Times Sit To Stand Test(5TSTS) within 10 seconds to improve lower extremity strength to facilitate safe ambulation around home and community  Baseline:  Goal status: COMPLETED 10/24/22  3.  Patient will be independent with a comprehensive strengthening HEP  Baseline:  Goal status: in progress  --------------------------------------------------------------------------------------------- PLAN:  PT FREQUENCY: 1x/week  PT DURATION: 8 weeks  PLANNED INTERVENTIONS: Therapeutic exercises, Therapeutic activity, Neuromuscular re-education, Balance training, Gait training, Patient/Family education, Self Care, and Joint mobilization.  PLAN FOR NEXT SESSION: Progress patient through LBP    Seymour Bars, PT 10/24/2022, 10:13 AM

## 2022-10-29 ENCOUNTER — Encounter (HOSPITAL_COMMUNITY): Payer: Medicare PPO

## 2022-10-31 ENCOUNTER — Encounter (HOSPITAL_COMMUNITY): Payer: Medicare PPO

## 2022-11-05 ENCOUNTER — Encounter (HOSPITAL_COMMUNITY): Payer: Medicare PPO

## 2022-11-07 ENCOUNTER — Ambulatory Visit (HOSPITAL_COMMUNITY): Payer: Medicare PPO

## 2022-11-07 DIAGNOSIS — M5459 Other low back pain: Secondary | ICD-10-CM

## 2022-11-07 DIAGNOSIS — R2689 Other abnormalities of gait and mobility: Secondary | ICD-10-CM

## 2022-11-07 NOTE — Addendum Note (Signed)
Addended by: Seymour Bars on: 11/07/2022 09:40 AM   Modules accepted: Orders

## 2022-11-07 NOTE — Therapy (Signed)
[facility] OUT[facility]IENT PHYSICAL THERAPY PROGRESS NOTE   Patient Name: Kimberly Woods MRN: [id number] DOB:13-Oct-1940, 82 y.o., female Today's Date: 11/07/2022  END OF SESSION:    PT End of Session - 11/07/22 0940     Visit Number 9    Number of Visits 16    Date for PT Re-Evaluation 12/19/22    Authorization Type [employer]    Authorization Time Period 9/12-11/7 approved    Authorization - Visit Number 2    Authorization - Number of Visits 10    Progress Note Due on Visit 16    PT Start Time 0930    PT Stop Time 1010    PT Time Calculation (min) 40 min    Activity Tolerance Patient tolerated treatment well                   Past Medical History:  Diagnosis Date   Allergy    Blood transfusion without reported diagnosis    after childbirth   Hypercholesterolemia 2018   Hypertension    Osteoporosis    Stroke (HCC) 05/2016   Past Surgical History:  Procedure Laterality Date   APPENDECTOMY  1963   TONSILLECTOMY  1949   TUBAL LIGATION  1974   VAGINAL HYSTERECTOMY     VITRECTOMY AND CATARACT Right 12/13/2016   Procedure: ANTERIOR VITRECTOMY AND CATARACT EXTRACTION RIGHT EYE CDE=18.58;  Surgeon: Fabio Pierce, MD;  Location: [facility];  Service: Ophthalmology;  Laterality: Right;   Patient Active Problem List   Diagnosis Date Noted   Spondylolisthesis, lumbar region 11/29/2021   Hypermagnesemia 11/29/2021   Hypokalemia 11/29/2021   Sciatica 11/07/2021   Pain of left lower leg 08/03/2021   Herpes zoster 06/02/2021   Left hemiplegia (HCC) 05/30/2021   Acute urinary tract infection 05/28/2021   Chronic low back pain 03/08/2021   Cervical radiculopathy 12/16/2020   Neck pain 12/16/2020   Impairment of balance 11/29/2020   Seborrheic keratosis 11/29/2020   Prediabetes 11/28/2020   Impaired fasting glucose 11/24/2020   CVA (cerebral vascular accident) (HCC) 06/05/2016   Hypertension 06/05/2016    PCP: Benita Stabile, MDRef Provider (PCP)   REFERRING  PROVIDER:   Vickki Hearing, MD    REFERRING DIAG: M43.17 (ICD-10-CM) - Spondylolisthesis, lumbosacral region   Rationale for Evaluation and Treatment: Rehabilitation  THERAPY DIAG:  Other abnormalities of gait and mobility  Other low back pain  ONSET DATE: Feb 2024 --------------------------------------------------------------------------------------------- SUBJECTIVE:  SUBJECTIVE STATEMENT:  Patient states left hip pain is on/off ; reports 6/10 on the left hip     Eval: Patient states she lives with husband. Her husband had hip surgery in Feb and fell in March causing him to undergo another hip surgery. Since then, patient takes care of her husband. Low back pain began late Spring 2024. Patient went to MD and prescribed medicine. Patient has been on muscle relaxer. Since July 15, patient still takes Celebrex and NSAID 2-3x a day. Patient will see her PCP this Wednesday.    PERTINENT HISTORY:  Hx CVA 2018, HTN, Osteoporosis, Spondylolisthesis  PAIN:  IE: Are you having pain? Yes: NPRS scale: 4/10 Pain location: lumbar Pain description: dull/ache  Aggravating factors: ADLs Relieving factors: laying  PRECAUTIONS: None  RED FLAGS: None   WEIGHT BEARING RESTRICTIONS: No  FALLS:  Has patient fallen in last 6 months? Yes. Number of falls 1 in March 2024  LIVING ENVIRONMENT: Lives with: lives with their spouse Lives in: House/apartment Stairs: Yes: Internal: 1 level steps; patient does not use stairs  Has following equipment at home: None  OCCUPATION: retired  PLOF: Independent  PATIENT GOALS: To be able to walk pain free  NEXT MD VISIT: FEB 2024 --------------------------------------------------------------------------------------------- OBJECTIVE:   DIAGNOSTIC FINDINGS:   X-rays show L4-5 grade 1/2 spondylolisthesis with mild curve which may be reactive and not structural   Degenerative changes are seen in the facet joints of L4-S1   Impression grade 1/2 spondylolisthesis with mild reactive curve and facet arthritis   SCREENING FOR RED FLAGS: Bowel or bladder incontinence: No Spinal tumors: No Cauda equina syndrome: No Compression fracture: No Abdominal aneurysm: No  COGNITION: Overall cognitive status: Within functional limits for tasks assessed  POSTURE: increased thoracic kyphosis and anterior pelvic tilt      FUNCTIONAL TESTS:  5 times sit to stand: 10.7s Timed up and go (TUG): 12.5s  10/24/22 Progress Note 5 times sit to stand: 8.73s Timed up and go (TUG): 10.84s  SENSATION: WFL  GAIT ANALYSIS: Distance walked: 78ft Assistive device utilized: None Level of assistance: Complete Independence Comments: Patient presents with unsteadiness on feet; minimal scissoring     LUMBAR ROM:   AROM eval 10/24/22  Flexion To shins Mid shin  Extension 5 10  Right lateral flexion To mid thigh WFL  Left lateral flexion To mid thigh WFL  Right rotation    Left rotation     (Blank rows = not tested; * = limited by pain)  LOWER EXTREMITY MMT:    MMT Right eval Left eval Right  10/24/22 Left 10/24/22  Hip flexion 3+/5 3+/5 4-/5 4-/5  Hip extension      Hip abduction      Hip adduction      Hip internal rotation      Hip external rotation      Knee flexion 3/5 3/5 3+/5 3+/5  Knee extension 4-/5 4-/5 4-/5 4-/5  Ankle dorsiflexion 3+/5 3+/5 4-/5 4-/5  Ankle plantarflexion      Ankle inversion      Ankle eversion       (Blank rows = not tested)   PALPATION: Minimal tenderness to palpation through lumbar paraspinals --------------------------------------------------------------------------------------------- TODAY'S TREATMENT:  DATE:   11/07/22 Manual therapy x10' -LAD to left hip -mobilization with movement to left hip flexion, IR  Therapeutic exercise x 30' -NuStep Warm up Seat 8 -Supine hip marches with G theraband @ knee  -Supine clams with G theraband x20 -Supine partial SLR's, bilateral x10      10/24/22 Therapeutic exercise x105min -Progress note objective testing -NuStep L3 Seat 8, handle 8 warm up -Leg Curl machine with 40# 2x10 -Marches with theraband shoulder ext     10/17/22 Therapeutic exercise x40' -Supine Single Knee to Chest Stretch  10 reps - 3 seconds hold -Supine March with Resistance Band  10 reps -Hooklying Isometric Hip Flexion  10 reps - 3 secs hold -Supine Bridge with Mini Swiss Ball Between Knees 10 reps - 3 secs hold -Clamshell with Resistance 10 reps - 3 seconds  hold   10/10/22 Therapeutic exercise x69min -NuStep L3 Seat 8, handle 8 warm up - Supine Single Knee to Chest Stretch  - 10 reps - 3 seconds hold - Supine March with Resistance Band  - 10 reps - 3" hold - Clamshell with Resistance  - 10 reps - 3 seconds  hold - Prone Press Up On Elbows  - 10 reps   PATIENT EDUCATION:  Education details: HEP, safety Person educated: Patient Education method: Explanation and Demonstration Education comprehension: verbalized understanding  HOME EXERCISE PROGRAM: Access Code: [email or web address] URL: [email or web address] Date: 10/17/2022 Prepared by: Seymour Bars  Exercises - Supine Single Knee to Chest Stretch  - 2 x daily - 5 x weekly - 10 reps - 3 seconds hold - Supine March with Resistance Band  - 2 x daily - 7 x weekly - 10 reps - Hooklying Isometric Hip Flexion  - 2 x daily - 7 x weekly - 10 reps - 3 secs hold - Supine Bridge with Mini Swiss Ball Between Knees  - 2 x daily - 7 x weekly - 10 reps - 3 secs hold - Clamshell with Resistance  - 2 x daily - 5 x weekly - 10 reps - 3 seconds  hold   ASSESSMENT:  CLINICAL IMPRESSION:  Today: As  per subjective, pt with increased left hip stiffness. PT to find PROM limited into flexion, IR. PT performed gentle left hip mobilizations with movement which provided relief. PT followed up with exercises to re-stabilize both hip joints. Patient tolerated well. Patient will continue to benefit from PT to return to prior level of function.    Progress note: Patient has improved in overall lower extremity strength by 1/3 grade, improved both functional tests by ~2seconds, and overall decreased pain levels. Patient still presents to PT with minimal LBP, decreased endurance/ strength,  ------------------------------------------------------------------------------------------------------------------ EVAL: Patient is a 82 y.o. female who was seen today for physical therapy evaluation and treatment for unsteadiness on feet and low back pain. Patient will benefit from PT to address the limitations/impairments listed below to return to prior level of function   OBJECTIVE IMPAIRMENTS: decreased balance, decreased endurance, difficulty walking, and decreased strength.   ACTIVITY LIMITATIONS: carrying, lifting, bending, sitting, standing, squatting, stairs, and dressing  PARTICIPATION LIMITATIONS: cleaning, laundry, driving, and community activity  PERSONAL FACTORS: Age and 1 comorbidity: stroke 2018  are also affecting patient's functional outcome.   REHAB POTENTIAL: Good  CLINICAL DECISION MAKING: Stable/uncomplicated  EVALUATION COMPLEXITY: Moderate  --------------------------------------------------------------------------------------------- GOALS: Goals reviewed with patient? Yes  SHORT TERM GOALS: Target date: 10/22/2022   Patient will be able to complete the Timed Up and Go Test (TUG) within 10  seconds to improve gait speed to facilitate safe ambulation around home and community  Baseline: Goal status: 10.84s 10/24/22- in progress  2.  .Patient will be able to complete the 5 Times Sit To  Stand Test(5TSTS) within 12 seconds to improve lower extremity strength to facilitate safe ambulation around home and community  Goal status: COMPLETED 10/24/22  3. Patient will be independent with a basic stretching/strengthening HEP  Baseline:  Goal status: COMPLETED 10/24/22   LONG TERM GOALS: Target date: 11/19/2022    Patient will be able to complete the Timed Up and Go Test (TUG) within 8 seconds to improve gait speed to facilitate safe ambulation around home and community  Baseline:  Goal status: in progress  2.  Patient will be able to complete the 5 Times Sit To Stand Test(5TSTS) within 10 seconds to improve lower extremity strength to facilitate safe ambulation around home and community  Baseline:  Goal status: COMPLETED 10/24/22  3.  Patient will be independent with a comprehensive strengthening HEP  Baseline:  Goal status: in progress  --------------------------------------------------------------------------------------------- PLAN:  PT FREQUENCY: 1x/week  PT DURATION: 8 weeks  PLANNED INTERVENTIONS: Therapeutic exercises, Therapeutic activity, Neuromuscular re-education, Balance training, Gait training, Patient/Family education, Self Care, and Joint mobilization.  PLAN FOR NEXT SESSION: Progress patient through LBP    Seymour Bars, PT 11/07/2022, 9:46 AM

## 2022-11-12 ENCOUNTER — Encounter (HOSPITAL_COMMUNITY): Payer: Medicare PPO

## 2022-11-14 ENCOUNTER — Ambulatory Visit (HOSPITAL_COMMUNITY): Payer: Medicare PPO | Attending: Orthopedic Surgery

## 2022-11-14 DIAGNOSIS — M5459 Other low back pain: Secondary | ICD-10-CM | POA: Diagnosis not present

## 2022-11-14 DIAGNOSIS — R2689 Other abnormalities of gait and mobility: Secondary | ICD-10-CM | POA: Insufficient documentation

## 2022-11-14 NOTE — Therapy (Signed)
[facility] OUT[facility]IENT PHYSICAL THERAPY PROGRESS NOTE   Patient Name: Kimberly Woods MRN: [id number] DOB:1940-06-05, 82 y.o., female Today's Date: 11/14/2022  END OF SESSION:    PT End of Session - 11/14/22 0947     Visit Number 10    Number of Visits 16    Date for PT Re-Evaluation 12/19/22    Authorization Type [employer]    Authorization Time Period 9/12-11/7    Authorization - Visit Number 3    Authorization - Number of Visits 10    Progress Note Due on Visit 16    PT Start Time 0935    PT Stop Time 1015    PT Time Calculation (min) 40 min    Activity Tolerance Patient tolerated treatment well    Behavior During Therapy [facility] for tasks assessed/performed                  Past Medical History:  Diagnosis Date   Allergy    Blood transfusion without reported diagnosis    after childbirth   Hypercholesterolemia 2018   Hypertension    Osteoporosis    Stroke (HCC) 05/2016   Past Surgical History:  Procedure Laterality Date   APPENDECTOMY  1963   TONSILLECTOMY  1949   TUBAL LIGATION  1974   VAGINAL HYSTERECTOMY     VITRECTOMY AND CATARACT Right 12/13/2016   Procedure: ANTERIOR VITRECTOMY AND CATARACT EXTRACTION RIGHT EYE CDE=18.58;  Surgeon: Fabio Pierce, MD;  Location: [facility];  Service: Ophthalmology;  Laterality: Right;   Patient Active Problem List   Diagnosis Date Noted   Spondylolisthesis, lumbar region 11/29/2021   Hypermagnesemia 11/29/2021   Hypokalemia 11/29/2021   Sciatica 11/07/2021   Pain of left lower leg 08/03/2021   Herpes zoster 06/02/2021   Left hemiplegia (HCC) 05/30/2021   Acute urinary tract infection 05/28/2021   Chronic low back pain 03/08/2021   Cervical radiculopathy 12/16/2020   Neck pain 12/16/2020   Impairment of balance 11/29/2020   Seborrheic keratosis 11/29/2020   Prediabetes 11/28/2020   Impaired fasting glucose 11/24/2020   CVA (cerebral vascular accident) (HCC) 06/05/2016   Hypertension 06/05/2016    PCP:  Benita Stabile, MDRef Provider (PCP)   REFERRING PROVIDER:   Vickki Hearing, MD    REFERRING DIAG: M43.17 (ICD-10-CM) - Spondylolisthesis, lumbosacral region   Rationale for Evaluation and Treatment: Rehabilitation  THERAPY DIAG:  No diagnosis found.  ONSET DATE: Feb 2024 --------------------------------------------------------------------------------------------- SUBJECTIVE:  SUBJECTIVE STATEMENT:  Today: Patient with no new complaints. Less pain in left hip   NPRs Scale 3/10 pain   Eval: Patient states she lives with husband. Her husband had hip surgery in Feb and fell in March causing him to undergo another hip surgery. Since then, patient takes care of her husband. Low back pain began late Spring 2024. Patient went to MD and prescribed medicine. Patient has been on muscle relaxer. Since July 15, patient still takes Celebrex and NSAID 2-3x a day. Patient will see her PCP this Wednesday.    PERTINENT HISTORY:  Hx CVA 2018, HTN, Osteoporosis, Spondylolisthesis  PAIN:  IE: Are you having pain? Yes: NPRS scale: 4/10 Pain location: lumbar Pain description: dull/ache  Aggravating factors: ADLs Relieving factors: laying  PRECAUTIONS: None  RED FLAGS: None   WEIGHT BEARING RESTRICTIONS: No  FALLS:  Has patient fallen in last 6 months? Yes. Number of falls 1 in March 2024  LIVING ENVIRONMENT: Lives with: lives with their spouse Lives in: House/apartment Stairs: Yes: Internal: 1 level steps; patient does not use stairs  Has following equipment at home: None  OCCUPATION: retired  PLOF: Independent  PATIENT GOALS: To be able to walk pain free  NEXT MD VISIT: FEB 2024 --------------------------------------------------------------------------------------------- OBJECTIVE:    DIAGNOSTIC FINDINGS:  X-rays show L4-5 grade 1/2 spondylolisthesis with mild curve which may be reactive and not structural   Degenerative changes are seen in the facet joints of L4-S1   Impression grade 1/2 spondylolisthesis with mild reactive curve and facet arthritis   SCREENING FOR RED FLAGS: Bowel or bladder incontinence: No Spinal tumors: No Cauda equina syndrome: No Compression fracture: No Abdominal aneurysm: No  COGNITION: Overall cognitive status: Within functional limits for tasks assessed  POSTURE: increased thoracic kyphosis and anterior pelvic tilt      FUNCTIONAL TESTS:  5 times sit to stand: 10.7s Timed up and go (TUG): 12.5s  10/24/22 Progress Note 5 times sit to stand: 8.73s Timed up and go (TUG): 10.84s  SENSATION: WFL  GAIT ANALYSIS: Distance walked: 68ft Assistive device utilized: None Level of assistance: Complete Independence Comments: Patient presents with unsteadiness on feet; minimal scissoring     LUMBAR ROM:   AROM eval 10/24/22  Flexion To shins Mid shin  Extension 5 10  Right lateral flexion To mid thigh WFL  Left lateral flexion To mid thigh WFL  Right rotation    Left rotation     (Blank rows = not tested; * = limited by pain)  LOWER EXTREMITY MMT:    MMT Right eval Left eval Right  10/24/22 Left 10/24/22  Hip flexion 3+/5 3+/5 4-/5 4-/5  Hip extension      Hip abduction      Hip adduction      Hip internal rotation      Hip external rotation      Knee flexion 3/5 3/5 3+/5 3+/5  Knee extension 4-/5 4-/5 4-/5 4-/5  Ankle dorsiflexion 3+/5 3+/5 4-/5 4-/5  Ankle plantarflexion      Ankle inversion      Ankle eversion       (Blank rows = not tested)   PALPATION: Minimal tenderness to palpation through lumbar paraspinals --------------------------------------------------------------------------------------------- TODAY'S TREATMENT:  DATE:  11/14/22 Therapeutic exercise x40' -NuStep Warm up Seat 8 -Supine FABER stretch -Supine gluteal stretch  -HEP review   11/07/22 Manual therapy x10' -LAD to left hip -mobilization with movement to left hip flexion, IR  Therapeutic exercise x 30' -NuStep Warm up Seat 8 -Supine hip marches with G theraband @ knee  -Supine clams with G theraband x20 -Supine partial SLR's, bilateral x10  PATIENT EDUCATION:  Education details: HEP, safety Person educated: Patient Education method: Explanation and Demonstration Education comprehension: verbalized understanding  HOME EXERCISE PROGRAM: Access Code: [email or web address] URL: [email or web address] Date: 10/17/2022 Prepared by: Seymour Bars  Exercises - Supine Single Knee to Chest Stretch  - 2 x daily - 5 x weekly - 10 reps - 3 seconds hold - Supine March with Resistance Band  - 2 x daily - 7 x weekly - 10 reps - Hooklying Isometric Hip Flexion  - 2 x daily - 7 x weekly - 10 reps - 3 secs hold - Supine Bridge with Mini Swiss Ball Between Knees  - 2 x daily - 7 x weekly - 10 reps - 3 secs hold - Clamshell with Resistance  - 2 x daily - 5 x weekly - 10 reps - 3 seconds  hold   ASSESSMENT:  CLINICAL IMPRESSION:  Today: As per subjective, pt with increased left hip stiffness. PT added hip stretching to HEP. PT followed up with exercises to re-stabilize both hip joints. Patient tolerated well. Patient will continue to benefit from PT to return to prior level of function.    Progress note: Patient has improved in overall lower extremity strength by 1/3 grade, improved both functional tests by ~2seconds, and overall decreased pain levels. Patient still presents to PT with minimal LBP, decreased endurance/ strength,  ------------------------------------------------------------------------------------------------------------------ EVAL: Patient is a 82 y.o. female who was seen today for  physical therapy evaluation and treatment for unsteadiness on feet and low back pain. Patient will benefit from PT to address the limitations/impairments listed below to return to prior level of function   OBJECTIVE IMPAIRMENTS: decreased balance, decreased endurance, difficulty walking, and decreased strength.   ACTIVITY LIMITATIONS: carrying, lifting, bending, sitting, standing, squatting, stairs, and dressing  PARTICIPATION LIMITATIONS: cleaning, laundry, driving, and community activity  PERSONAL FACTORS: Age and 1 comorbidity: stroke 2018  are also affecting patient's functional outcome.   REHAB POTENTIAL: Good  CLINICAL DECISION MAKING: Stable/uncomplicated  EVALUATION COMPLEXITY: Moderate  --------------------------------------------------------------------------------------------- GOALS: Goals reviewed with patient? Yes  SHORT TERM GOALS: Target date: 10/22/2022   Patient will be able to complete the Timed Up and Go Test (TUG) within 10 seconds to improve gait speed to facilitate safe ambulation around home and community  Baseline: Goal status: 10.84s 10/24/22- in progress  2.  .Patient will be able to complete the 5 Times Sit To Stand Test(5TSTS) within 12 seconds to improve lower extremity strength to facilitate safe ambulation around home and community  Goal status: COMPLETED 10/24/22  3. Patient will be independent with a basic stretching/strengthening HEP  Baseline:  Goal status: COMPLETED 10/24/22   LONG TERM GOALS: Target date: 11/19/2022    Patient will be able to complete the Timed Up and Go Test (TUG) within 8 seconds to improve gait speed to facilitate safe ambulation around home and community  Baseline:  Goal status: in progress  2.  Patient will be able to complete the 5 Times Sit To Stand Test(5TSTS) within 10 seconds to improve lower extremity strength to facilitate safe ambulation around home and community  Baseline:  Goal status: COMPLETED 10/24/22  3.   Patient will be independent with a comprehensive strengthening HEP  Baseline:  Goal status: in progress  --------------------------------------------------------------------------------------------- PLAN:  PT FREQUENCY: 1x/week  PT DURATION: 8 weeks  PLANNED INTERVENTIONS: Therapeutic exercises, Therapeutic activity, Neuromuscular re-education, Balance training, Gait training, Patient/Family education, Self Care, and Joint mobilization.  PLAN FOR NEXT SESSION: Progress patient through LBP    Seymour Bars, PT 11/14/2022, 4:10 PM

## 2022-11-28 ENCOUNTER — Ambulatory Visit (HOSPITAL_COMMUNITY): Payer: Medicare PPO

## 2022-11-28 DIAGNOSIS — M5459 Other low back pain: Secondary | ICD-10-CM | POA: Diagnosis not present

## 2022-11-28 DIAGNOSIS — R2689 Other abnormalities of gait and mobility: Secondary | ICD-10-CM

## 2022-11-28 NOTE — Therapy (Signed)
[facility] OUTPATIENT PHYSICAL THERAPY PROGRESS NOTE   Patient Name: Kimberly Woods MRN: [id number] DOB:10-Feb-1941, 82 y.o., female Today's Date: 11/28/2022  END OF SESSION:    PT End of Session - 11/28/22 1109     Visit Number 11    Number of Visits 16    Date for PT Re-Evaluation 12/19/22    Authorization Type [employer]    Authorization Time Period 9/12-11/7    Authorization - Number of Visits 10    Progress Note Due on Visit 16    PT Start Time 1107    PT Stop Time 1145    PT Time Calculation (min) 38 min    Activity Tolerance Patient tolerated treatment well    Behavior During Therapy WFL for tasks assessed/performed                  Past Medical History:  Diagnosis Date   Allergy    Blood transfusion without reported diagnosis    after childbirth   Hypercholesterolemia 2018   Hypertension    Osteoporosis    Stroke (HCC) 05/2016   Past Surgical History:  Procedure Laterality Date   APPENDECTOMY  1963   TONSILLECTOMY  1949   TUBAL LIGATION  1974   VAGINAL HYSTERECTOMY     VITRECTOMY AND CATARACT Right 12/13/2016   Procedure: ANTERIOR VITRECTOMY AND CATARACT EXTRACTION RIGHT EYE CDE=18.58;  Surgeon: Fabio Pierce, MD;  Location: [facility];  Service: Ophthalmology;  Laterality: Right;   Patient Active Problem List   Diagnosis Date Noted   Spondylolisthesis, lumbar region 11/29/2021   Hypermagnesemia 11/29/2021   Hypokalemia 11/29/2021   Sciatica 11/07/2021   Pain of left lower leg 08/03/2021   Herpes zoster 06/02/2021   Left hemiplegia (HCC) 05/30/2021   Acute urinary tract infection 05/28/2021   Chronic low back pain 03/08/2021   Cervical radiculopathy 12/16/2020   Neck pain 12/16/2020   Impairment of balance 11/29/2020   Seborrheic keratosis 11/29/2020   Prediabetes 11/28/2020   Impaired fasting glucose 11/24/2020   CVA (cerebral vascular accident) (HCC) 06/05/2016   Hypertension 06/05/2016    PCP: Benita Stabile, MDRef Provider (PCP)    REFERRING PROVIDER:   Vickki Hearing, MD    REFERRING DIAG: M43.17 (ICD-10-CM) - Spondylolisthesis, lumbosacral region   Rationale for Evaluation and Treatment: Rehabilitation  THERAPY DIAG:  Other abnormalities of gait and mobility  Other low back pain  ONSET DATE: Feb 2024 --------------------------------------------------------------------------------------------- SUBJECTIVE:  SUBJECTIVE STATEMENT:  Today: Patient with no new complaints. Feels as if she her low back pain is not improving anymore; 3/10 today   Eval: Patient states she lives with husband. Her husband had hip surgery in Feb and fell in March causing him to undergo another hip surgery. Since then, patient takes care of her husband. Low back pain began late Spring 2024. Patient went to MD and prescribed medicine. Patient has been on muscle relaxer. Since July 15, patient still takes Celebrex and NSAID 2-3x a day. Patient will see her PCP this Wednesday.    PERTINENT HISTORY:  Hx CVA 2018, HTN, Osteoporosis, Spondylolisthesis  PAIN:  IE: Are you having pain? Yes: NPRS scale: 4/10 Pain location: lumbar Pain description: dull/ache  Aggravating factors: ADLs Relieving factors: laying  PRECAUTIONS: None  RED FLAGS: None   WEIGHT BEARING RESTRICTIONS: No  FALLS:  Has patient fallen in last 6 months? Yes. Number of falls 1 in March 2024  LIVING ENVIRONMENT: Lives with: lives with their spouse Lives in: House/apartment Stairs: Yes: Internal: 1 level steps; patient does not use stairs  Has following equipment at home: None  OCCUPATION: retired  PLOF: Independent  PATIENT GOALS: To be able to walk pain free  NEXT MD VISIT: FEB  2024 --------------------------------------------------------------------------------------------- OBJECTIVE:   DIAGNOSTIC FINDINGS:  X-rays show L4-5 grade 1/2 spondylolisthesis with mild curve which may be reactive and not structural   Degenerative changes are seen in the facet joints of L4-S1   Impression grade 1/2 spondylolisthesis with mild reactive curve and facet arthritis   SCREENING FOR RED FLAGS: Bowel or bladder incontinence: No Spinal tumors: No Cauda equina syndrome: No Compression fracture: No Abdominal aneurysm: No  COGNITION: Overall cognitive status: Within functional limits for tasks assessed  POSTURE: increased thoracic kyphosis and anterior pelvic tilt      FUNCTIONAL TESTS:  5 times sit to stand: 10.7s Timed up and go (TUG): 12.5s  10/24/22 Progress Note 5 times sit to stand: 8.73s Timed up and go (TUG): 10.84s  SENSATION: WFL  GAIT ANALYSIS: Distance walked: 80ft Assistive device utilized: None Level of assistance: Complete Independence Comments: Patient presents with unsteadiness on feet; minimal scissoring     LUMBAR ROM:   AROM eval 10/24/22  Flexion To shins Mid shin  Extension 5 10  Right lateral flexion To mid thigh WFL  Left lateral flexion To mid thigh WFL  Right rotation    Left rotation     (Blank rows = not tested; * = limited by pain)  LOWER EXTREMITY MMT:    MMT Right eval Left eval Right  10/24/22 Left 10/24/22  Hip flexion 3+/5 3+/5 4-/5 4-/5  Hip extension      Hip abduction      Hip adduction      Hip internal rotation      Hip external rotation      Knee flexion 3/5 3/5 3+/5 3+/5  Knee extension 4-/5 4-/5 4-/5 4-/5  Ankle dorsiflexion 3+/5 3+/5 4-/5 4-/5  Ankle plantarflexion      Ankle inversion      Ankle eversion       (Blank rows = not tested)   PALPATION: Minimal tenderness to palpation through lumbar  paraspinals --------------------------------------------------------------------------------------------- TODAY'S TREATMENT:  DATE:   11/28/22 HEP review  Supine Single Knee to Chest Stretch Supine March with Resistance Band   Hooklying Isometric Hip Flexion   Supine Bridge with Mini Swiss Ball Between Knees Clamshell with Resistance Gait Training with single point cane  Walking Program    11/14/22 Therapeutic exercise x40' -NuStep Warm up Seat 8 -Supine FABER stretch -Supine gluteal stretch  -HEP review   11/07/22 Manual therapy x10' -LAD to left hip -mobilization with movement to left hip flexion, IR  Therapeutic exercise x 30' -NuStep Warm up Seat 8 -Supine hip marches with G theraband @ knee  -Supine clams with G theraband x20 -Supine partial SLR's, bilateral x10  PATIENT EDUCATION:  Education details: HEP, safety Person educated: Patient Education method: Explanation and Demonstration Education comprehension: verbalized understanding  HOME EXERCISE PROGRAM: Access Code: [email or web address] URL: [email or web address] Date: 10/17/2022 Prepared by: Seymour Bars  Exercises - Supine Single Knee to Chest Stretch  - 2 x daily - 5 x weekly - 10 reps - 3 seconds hold - Supine March with Resistance Band  - 2 x daily - 7 x weekly - 10 reps - Hooklying Isometric Hip Flexion  - 2 x daily - 7 x weekly - 10 reps - 3 secs hold - Supine Bridge with Mini Swiss Ball Between Knees  - 2 x daily - 7 x weekly - 10 reps - 3 secs hold - Clamshell with Resistance  - 2 x daily - 5 x weekly - 10 reps - 3 seconds  hold   ASSESSMENT:  CLINICAL IMPRESSION:  Today: As per subjective, patient feels a plateau in her progress during PT due to low back pain when walking. PT reviewed and demo'd HEP in which patient show GOOD understanding. PT initiated a basic  walking program using an single point cane with 2- pt step through. Patient demo's improved endurance around clinic when using a single point cane. Pt's walking program will begin as 10-67min / day to see if symptoms improve. Patient will benefit from PT to return to PLOF     Progress note: Patient has improved in overall lower extremity strength by 1/3 grade, improved both functional tests by ~2seconds, and overall decreased pain levels. Patient still presents to PT with minimal LBP, decreased endurance/ strength,  ------------------------------------------------------------------------------------------------------------------ EVAL: Patient is a 82 y.o. female who was seen today for physical therapy evaluation and treatment for unsteadiness on feet and low back pain. Patient will benefit from PT to address the limitations/impairments listed below to return to prior level of function   OBJECTIVE IMPAIRMENTS: decreased balance, decreased endurance, difficulty walking, and decreased strength.   ACTIVITY LIMITATIONS: carrying, lifting, bending, sitting, standing, squatting, stairs, and dressing  PARTICIPATION LIMITATIONS: cleaning, laundry, driving, and community activity  PERSONAL FACTORS: Age and 1 comorbidity: stroke 2018  are also affecting patient's functional outcome.   REHAB POTENTIAL: Good  CLINICAL DECISION MAKING: Stable/uncomplicated  EVALUATION COMPLEXITY: Moderate  --------------------------------------------------------------------------------------------- GOALS: Goals reviewed with patient? Yes  SHORT TERM GOALS: Target date: 10/22/2022   Patient will be able to complete the Timed Up and Go Test (TUG) within 10 seconds to improve gait speed to facilitate safe ambulation around home and community  Baseline: Goal status: 10.84s 10/24/22- in progress  2.  .Patient will be able to complete the 5 Times Sit To Stand Test(5TSTS) within 12 seconds to improve lower extremity  strength to facilitate safe ambulation around home and community  Goal status: COMPLETED 10/24/22  3. Patient will be independent with a basic stretching/strengthening  HEP  Baseline:  Goal status: COMPLETED 10/24/22   LONG TERM GOALS: Target date: 11/19/2022    Patient will be able to complete the Timed Up and Go Test (TUG) within 8 seconds to improve gait speed to facilitate safe ambulation around home and community  Baseline:  Goal status: in progress  2.  Patient will be able to complete the 5 Times Sit To Stand Test(5TSTS) within 10 seconds to improve lower extremity strength to facilitate safe ambulation around home and community  Baseline:  Goal status: COMPLETED 10/24/22  3.  Patient will be independent with a comprehensive strengthening HEP  Baseline:  Goal status: in progress  --------------------------------------------------------------------------------------------- PLAN:  PT FREQUENCY: 1x/week  PT DURATION: 8 weeks  PLANNED INTERVENTIONS: Therapeutic exercises, Therapeutic activity, Neuromuscular re-education, Balance training, Gait training, Patient/Family education, Self Care, and Joint mobilization.  PLAN FOR NEXT SESSION: Progress patient through LBP    Seymour Bars, PT 11/28/2022, 11:10 AM

## 2022-12-05 ENCOUNTER — Encounter (HOSPITAL_COMMUNITY): Payer: Medicare PPO

## 2022-12-11 DIAGNOSIS — Z1231 Encounter for screening mammogram for malignant neoplasm of breast: Secondary | ICD-10-CM | POA: Diagnosis not present

## 2022-12-11 DIAGNOSIS — Z01419 Encounter for gynecological examination (general) (routine) without abnormal findings: Secondary | ICD-10-CM | POA: Diagnosis not present

## 2022-12-11 DIAGNOSIS — N952 Postmenopausal atrophic vaginitis: Secondary | ICD-10-CM | POA: Diagnosis not present

## 2022-12-11 DIAGNOSIS — Z6826 Body mass index (BMI) 26.0-26.9, adult: Secondary | ICD-10-CM | POA: Diagnosis not present

## 2022-12-12 ENCOUNTER — Encounter (HOSPITAL_COMMUNITY): Payer: Medicare PPO

## 2022-12-17 ENCOUNTER — Encounter (HOSPITAL_COMMUNITY): Payer: Self-pay

## 2022-12-17 ENCOUNTER — Encounter (HOSPITAL_COMMUNITY): Payer: Medicare PPO

## 2022-12-17 ENCOUNTER — Telehealth (HOSPITAL_COMMUNITY): Payer: Self-pay

## 2022-12-17 NOTE — Telephone Encounter (Signed)
No show #3, called and left message concerning missed apt. Included no show policy details and included contact number for questions. Cancelled remaining apt.   Becky Sax, LPTA/CLT; CBIS [phone]  Juel Burrow, PTA 12/17/2022, 10:57 AM

## 2022-12-17 NOTE — Therapy (Signed)
PHYSICAL THERAPY DISCHARGE SUMMARY  Visits from Start of Care: 11  Current functional level related to goals / functional outcomes: N/a   Remaining deficits: N/a   Education / Equipment: N/a   Patient informed of No Show Policy; patient has had (3) no shows and is discharged as per company policy. Patient goals were partially met. Patient is being discharged due to  poor attendance.    [facility] at [facility] [address] [place], [place], [address] Phone: [phone]   Fax:  [phone]  Patient Details  Name: Kimberly Woods MRN: [id number] Date of Birth: 03-07-1940 Referring Provider:  No ref. provider found  Encounter Date: 12/17/2022   Seymour Bars, PT 12/17/2022, 1:52 PM  [facility] Out[facility] Rehabilitation at [facility] [address] [place], [place], [address] Phone: [phone]   Fax:  [phone]

## 2022-12-19 ENCOUNTER — Encounter (HOSPITAL_COMMUNITY): Payer: Medicare PPO

## 2023-02-20 NOTE — Progress Notes (Signed)
   BP (!) 138/94   Pulse 80   Ht 5' 1 (1.549 m)   Wt 141 lb (64 kg)   BMI 26.64 kg/m   Body mass index is 26.64 kg/m.  Chief Complaint  Patient presents with   Back Pain    Has been doing ok but shoveled snow with some increased soreness    No diagnosis found.  DOI/DOS/ Date: back pain for long duration   Improved with physical therapy

## 2023-02-24 ENCOUNTER — Encounter: Payer: Self-pay | Admitting: Orthopedic Surgery

## 2023-02-24 ENCOUNTER — Ambulatory Visit: Payer: Medicare PPO | Admitting: Orthopedic Surgery

## 2023-02-24 VITALS — BP 138/94 | HR 80 | Ht 61.0 in | Wt 141.0 lb

## 2023-02-24 DIAGNOSIS — M79604 Pain in right leg: Secondary | ICD-10-CM

## 2023-02-24 DIAGNOSIS — M79605 Pain in left leg: Secondary | ICD-10-CM

## 2023-02-24 DIAGNOSIS — M4317 Spondylolisthesis, lumbosacral region: Secondary | ICD-10-CM

## 2023-02-24 NOTE — Progress Notes (Signed)
  Subjective:     Patient ID: Kimberly Woods, female   DOB: Jul 07, 1940, 83 y.o.   MRN: [id number]    BP (!) 138/94   Pulse 80   Ht 5' 1 (1.549 m)   Wt 141 lb (64 kg)   BMI 26.64 kg/m   Body mass index is 26.64 kg/m.  Chief Complaint Patient presents with  Back Pain   Has been doing ok but shoveled snow with some increased soreness   Lumbar pain with radiation down both legs  (primary encounter diagnosis) Spondylolisthesis, lumbosacral region   DOI/DOS/ Date: back pain for long duration   Improved with physical therapy        Review of Systems  Constitutional:  Negative for fever and unexpected weight change.  Cardiovascular:  Positive for leg swelling.  Gastrointestinal: Negative.   Genitourinary: Negative.   Neurological:  Negative for numbness.       Objective:   Physical Exam Good flexion down to just the ankle level little bit of restrictions on extension no tenderness in the midline some tenderness off to the side left and right no neurologic symptoms    Assessment:     Significantly improved only taking Tylenol  Home exercises are performed as well  Encounter Diagnoses  Name Primary?   Lumbar pain with radiation down both legs Yes   Spondylolisthesis, lumbosacral region        Plan:     Continue home exercises  Continue Tylenol   She can stop taking the tizanidine   Return as needed

## 2023-03-21 DIAGNOSIS — E782 Mixed hyperlipidemia: Secondary | ICD-10-CM | POA: Diagnosis not present

## 2023-03-21 DIAGNOSIS — R7303 Prediabetes: Secondary | ICD-10-CM | POA: Diagnosis not present

## 2023-03-27 DIAGNOSIS — I6529 Occlusion and stenosis of unspecified carotid artery: Secondary | ICD-10-CM | POA: Diagnosis not present

## 2023-03-27 DIAGNOSIS — M7989 Other specified soft tissue disorders: Secondary | ICD-10-CM | POA: Diagnosis not present

## 2023-03-27 DIAGNOSIS — E876 Hypokalemia: Secondary | ICD-10-CM | POA: Diagnosis not present

## 2023-03-27 DIAGNOSIS — M4316 Spondylolisthesis, lumbar region: Secondary | ICD-10-CM | POA: Diagnosis not present

## 2023-03-27 DIAGNOSIS — I10 Essential (primary) hypertension: Secondary | ICD-10-CM | POA: Diagnosis not present

## 2023-03-27 DIAGNOSIS — Z8673 Personal history of transient ischemic attack (TIA), and cerebral infarction without residual deficits: Secondary | ICD-10-CM | POA: Diagnosis not present

## 2023-03-27 DIAGNOSIS — E782 Mixed hyperlipidemia: Secondary | ICD-10-CM | POA: Diagnosis not present

## 2023-03-27 DIAGNOSIS — R7303 Prediabetes: Secondary | ICD-10-CM | POA: Diagnosis not present

## 2023-04-29 DIAGNOSIS — H35372 Puckering of macula, left eye: Secondary | ICD-10-CM | POA: Diagnosis not present

## 2023-05-21 DIAGNOSIS — H9190 Unspecified hearing loss, unspecified ear: Secondary | ICD-10-CM | POA: Diagnosis not present

## 2023-05-21 DIAGNOSIS — H9103 Ototoxic hearing loss, bilateral: Secondary | ICD-10-CM | POA: Diagnosis not present

## 2023-05-21 DIAGNOSIS — J302 Other seasonal allergic rhinitis: Secondary | ICD-10-CM | POA: Diagnosis not present

## 2023-08-05 DIAGNOSIS — W57XXXA Bitten or stung by nonvenomous insect and other nonvenomous arthropods, initial encounter: Secondary | ICD-10-CM | POA: Diagnosis not present

## 2023-08-05 DIAGNOSIS — S50861A Insect bite (nonvenomous) of right forearm, initial encounter: Secondary | ICD-10-CM | POA: Diagnosis not present

## 2023-09-18 DIAGNOSIS — E782 Mixed hyperlipidemia: Secondary | ICD-10-CM | POA: Diagnosis not present

## 2023-09-18 DIAGNOSIS — R7303 Prediabetes: Secondary | ICD-10-CM | POA: Diagnosis not present

## 2023-09-24 DIAGNOSIS — E782 Mixed hyperlipidemia: Secondary | ICD-10-CM | POA: Diagnosis not present

## 2023-09-24 DIAGNOSIS — I10 Essential (primary) hypertension: Secondary | ICD-10-CM | POA: Diagnosis not present

## 2023-09-24 DIAGNOSIS — I6529 Occlusion and stenosis of unspecified carotid artery: Secondary | ICD-10-CM | POA: Diagnosis not present

## 2023-09-24 DIAGNOSIS — M7989 Other specified soft tissue disorders: Secondary | ICD-10-CM | POA: Diagnosis not present

## 2023-09-24 DIAGNOSIS — R7303 Prediabetes: Secondary | ICD-10-CM | POA: Diagnosis not present

## 2023-09-24 DIAGNOSIS — M4316 Spondylolisthesis, lumbar region: Secondary | ICD-10-CM | POA: Diagnosis not present

## 2023-09-24 DIAGNOSIS — E876 Hypokalemia: Secondary | ICD-10-CM | POA: Diagnosis not present

## 2023-09-24 DIAGNOSIS — Z8673 Personal history of transient ischemic attack (TIA), and cerebral infarction without residual deficits: Secondary | ICD-10-CM | POA: Diagnosis not present

## 2023-11-21 DIAGNOSIS — H35033 Hypertensive retinopathy, bilateral: Secondary | ICD-10-CM | POA: Diagnosis not present

## 2024-04-15 IMAGING — US US CAROTID DUPLEX BILAT
1 series · 14 of 24 positions shown · non-contrast
Comparison: 06/06/2016

CLINICAL DATA: Occlusion and stenosis of carotid artery

Hypertension
Stroke
Hyperlipidemia
EXAM:
BILATERAL CAROTID DUPLEX ULTRASOUND
TECHNIQUE: Gray scale imaging, color Doppler and duplex ultrasound were
performed of bilateral carotid and vertebral arteries in the neck.

[Series 1: us carotid bilateral · 14 of 68 slices shown]
[im 1/68]
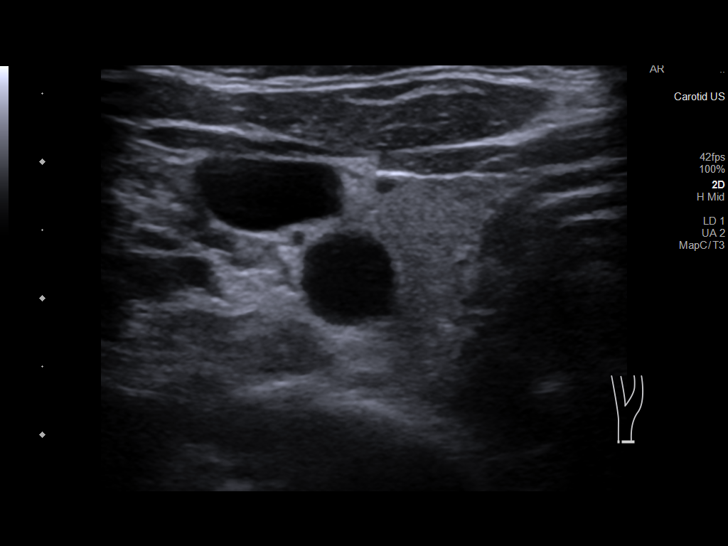
[im 6/68]
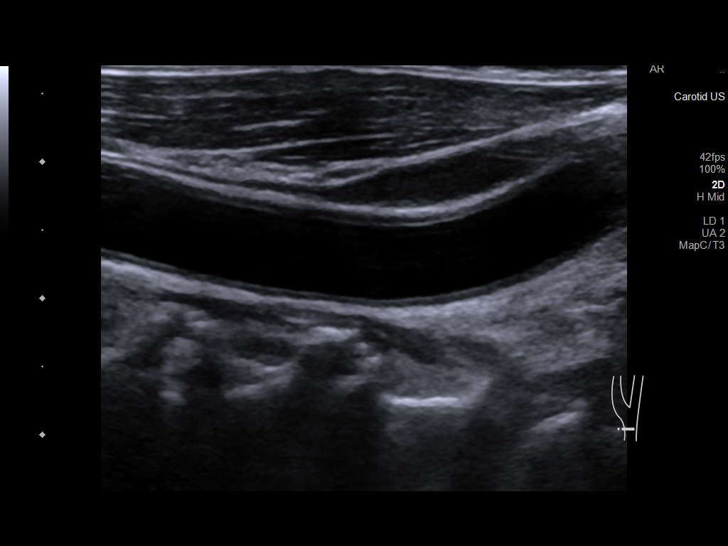
[im 12/68]
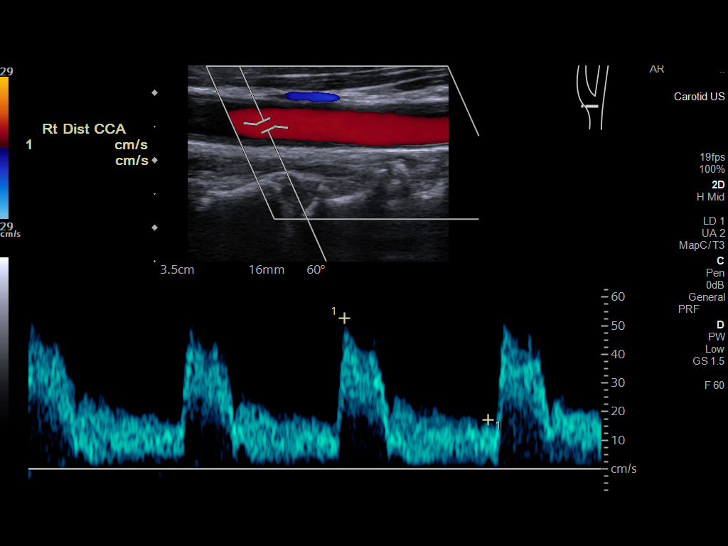
[im 18/68]
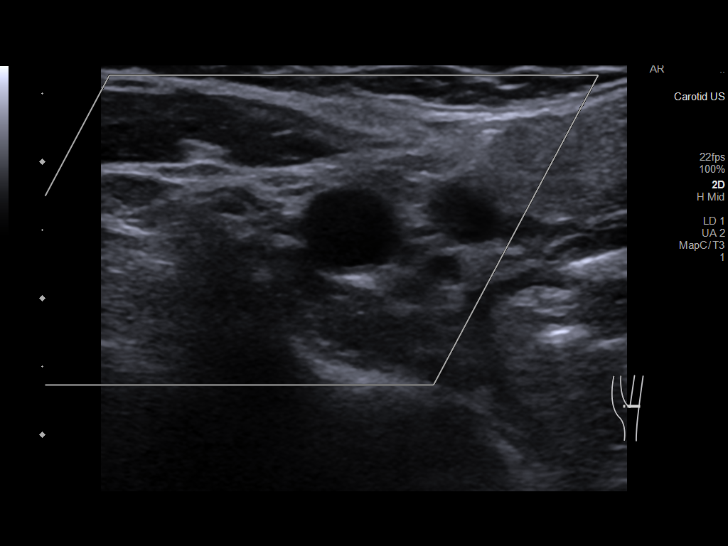
[im 21/68]
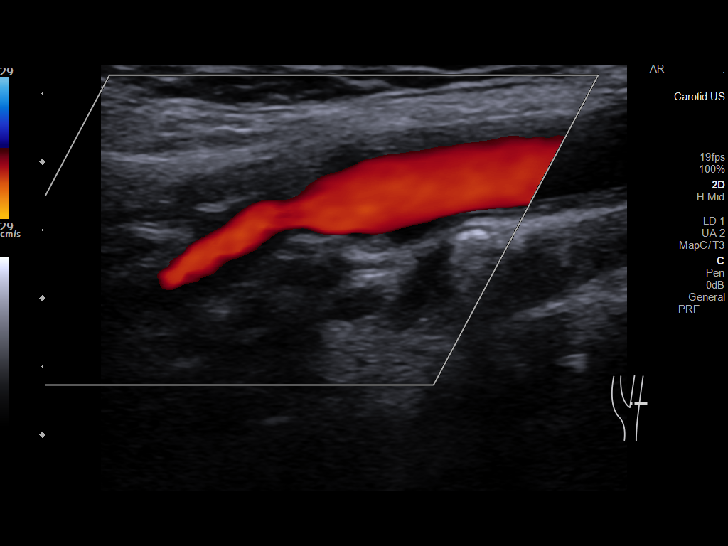
[im 27/68]
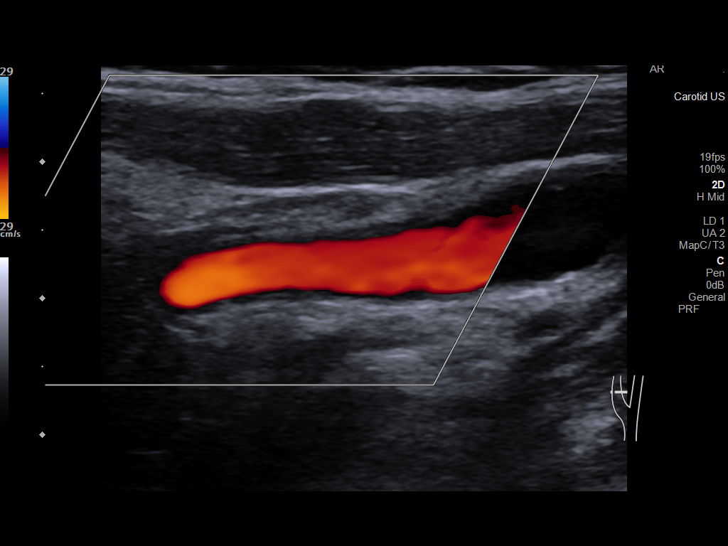
[im 33/68]
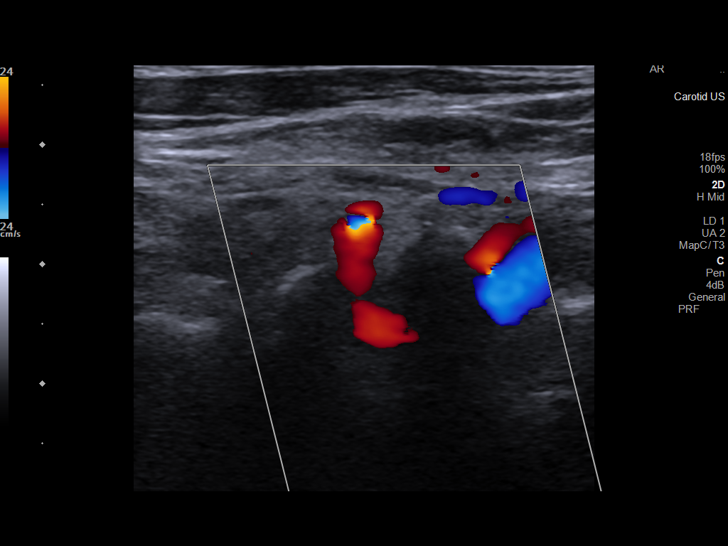
[im 35/68]
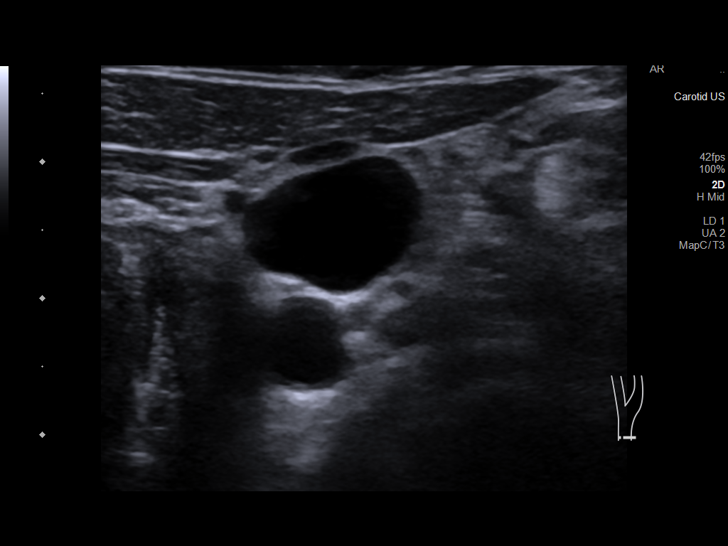
[im 41/68]
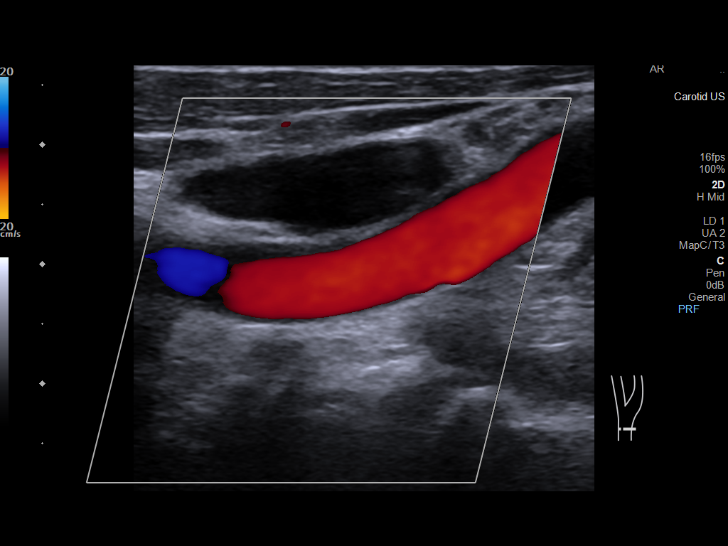
[im 47/68]
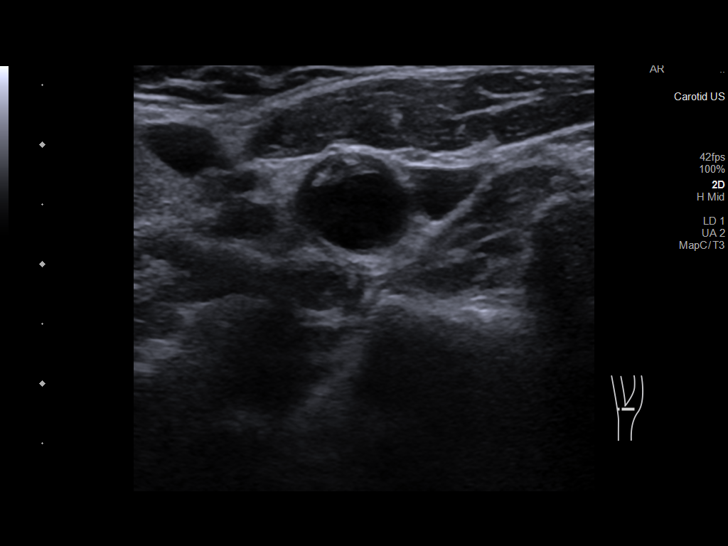
[im 53/68]
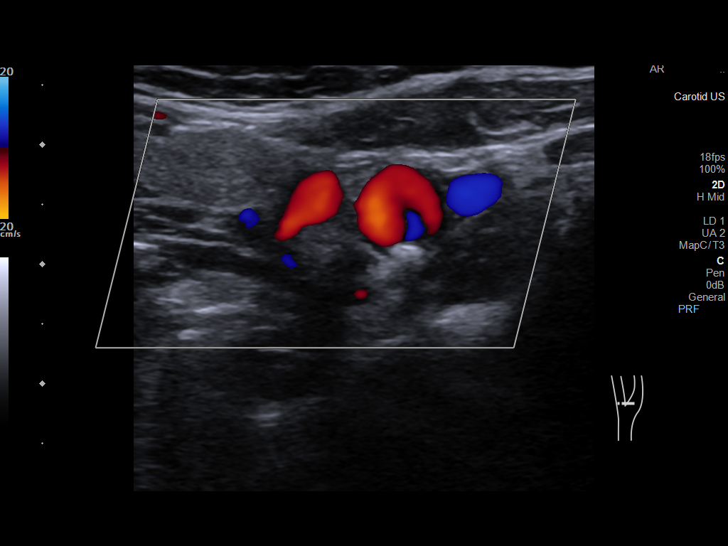
[im 56/68]
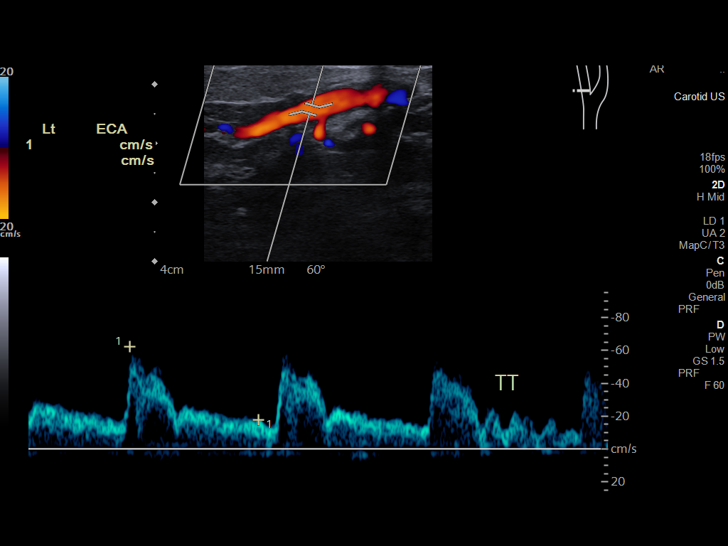
[im 62/68]
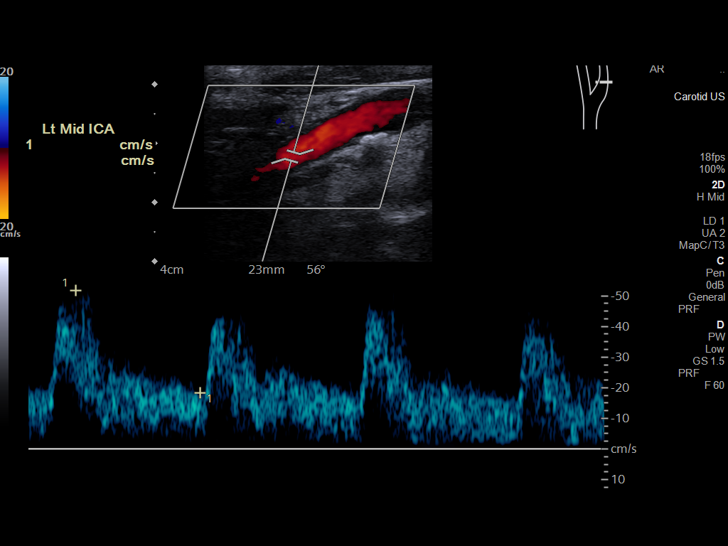
[im 68/68]
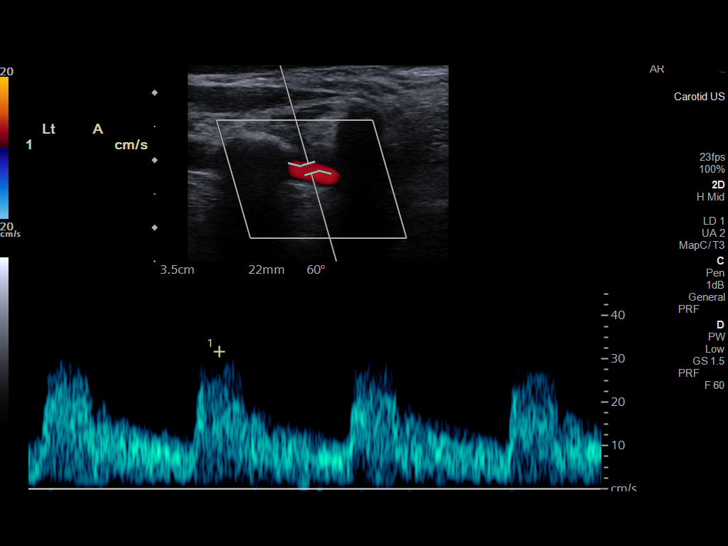

[14 of 24 positions shown; findings below may reference images not displayed]

FINDINGS: Criteria: Quantification of carotid stenosis is based on velocity
parameters that correlate the residual internal carotid diameter
with NASCET-based stenosis levels, using the diameter of the distal
internal carotid lumen as the denominator for stenosis measurement.

The following velocity measurements were obtained:

RIGHT

ICA: 49/17 cm/sec

CCA: 67/12 cm/sec

SYSTOLIC ICA/CCA RATIO:

ECA: 56 cm/sec

LEFT

ICA: 67/28 cm/sec

CCA: 66/22 cm/sec

SYSTOLIC ICA/CCA RATIO:

ECA: 62 cm/sec

RIGHT CAROTID ARTERY: No significant atheromatous plaque.

RIGHT VERTEBRAL ARTERY:  Antegrade flow.

LEFT CAROTID ARTERY: Mild atheromatous plaque of the carotid
bifurcation.

LEFT VERTEBRAL ARTERY:  Antegrade flow.
IMPRESSION: No significant stenosis of internal carotid arteries.

## 2024-06-07 IMAGING — US US EXTREM LOW VENOUS
1 series · 14 of 24 positions shown · non-contrast
Comparison: None Available.

CLINICAL DATA: Bilateral lower extremity pain and edema for 1 week

EXAM:
BILATERAL LOWER EXTREMITY VENOUS DOPPLER ULTRASOUND
TECHNIQUE: Gray-scale sonography with compression, as well as color and duplex
ultrasound, were performed to evaluate the deep venous system(s)
from the level of the common femoral vein through the popliteal and
proximal calf veins.

[Series 1: us venous img lower bilat (dvt) · portal-venous · 14 of 73 slices shown]
[im 1/73]
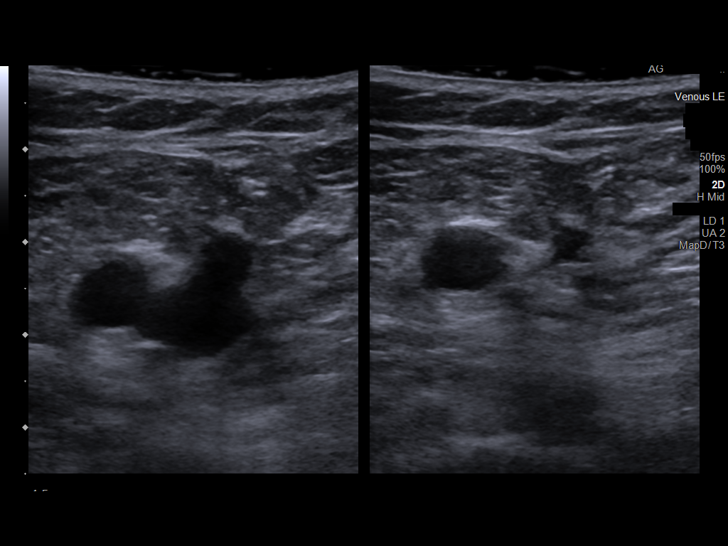
[im 7/73]
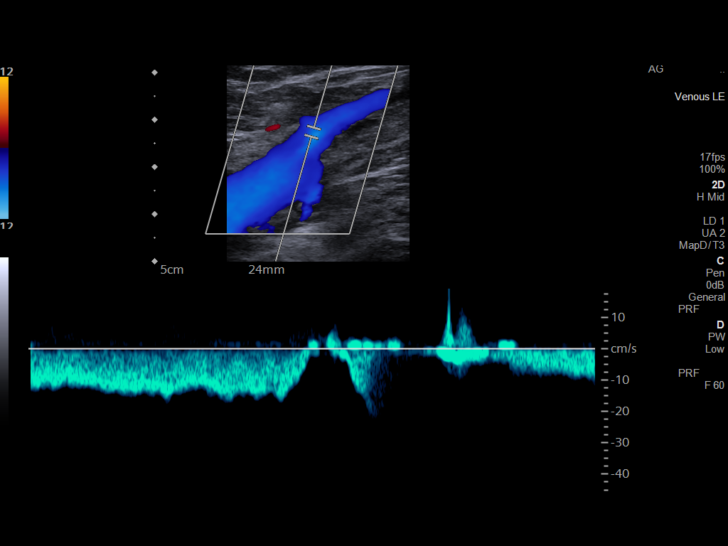
[im 13/73]
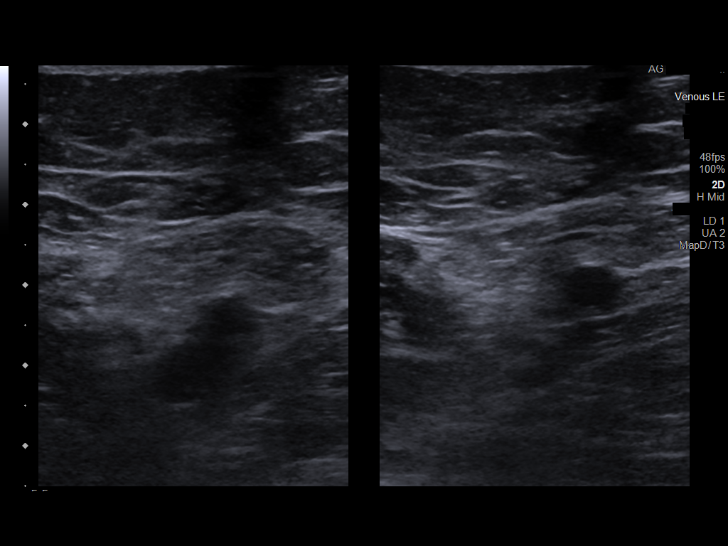
[im 19/73]
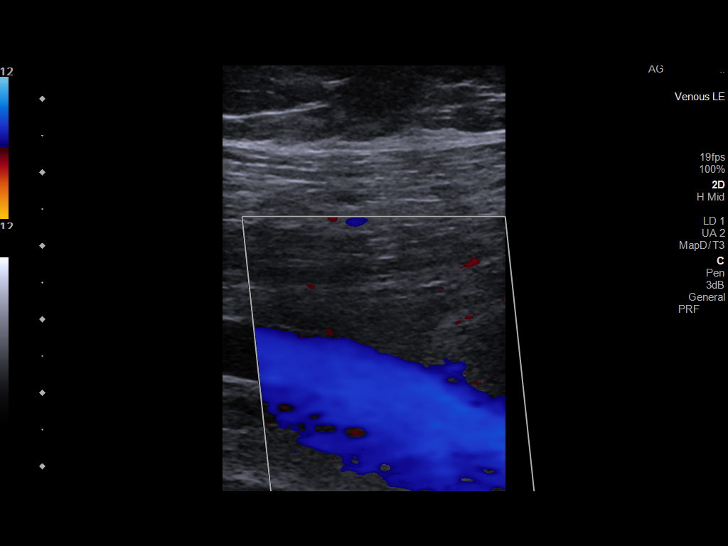
[im 22/73]
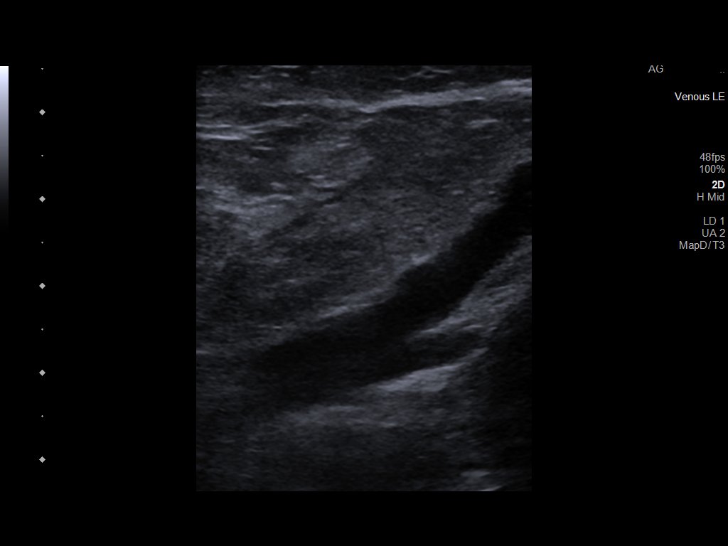
[im 29/73]
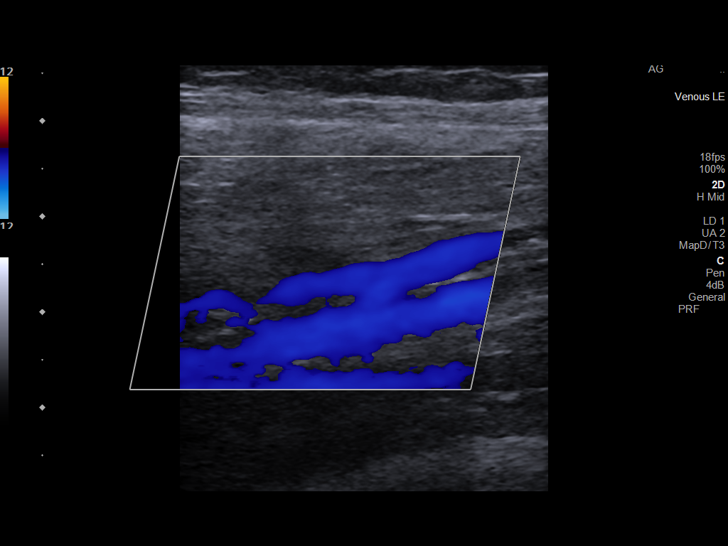
[im 35/73]
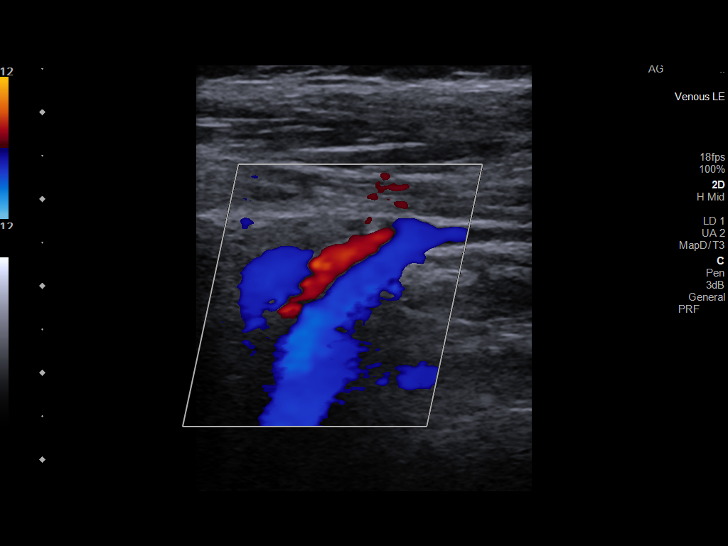
[im 38/73]
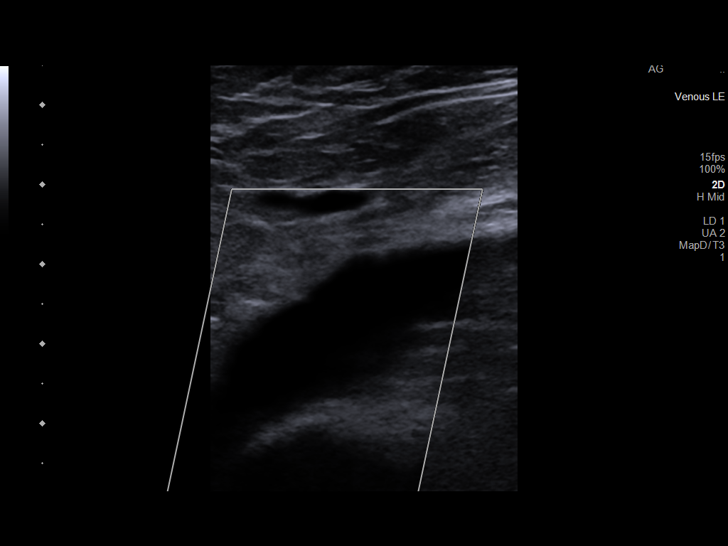
[im 44/73]
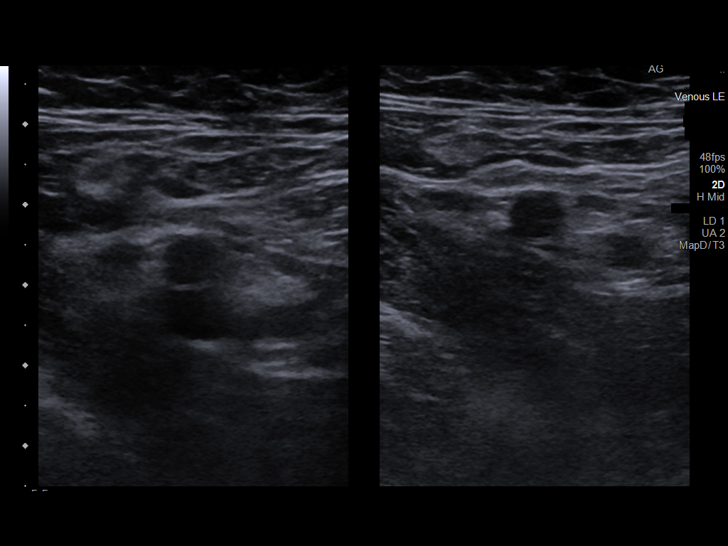
[im 51/73]
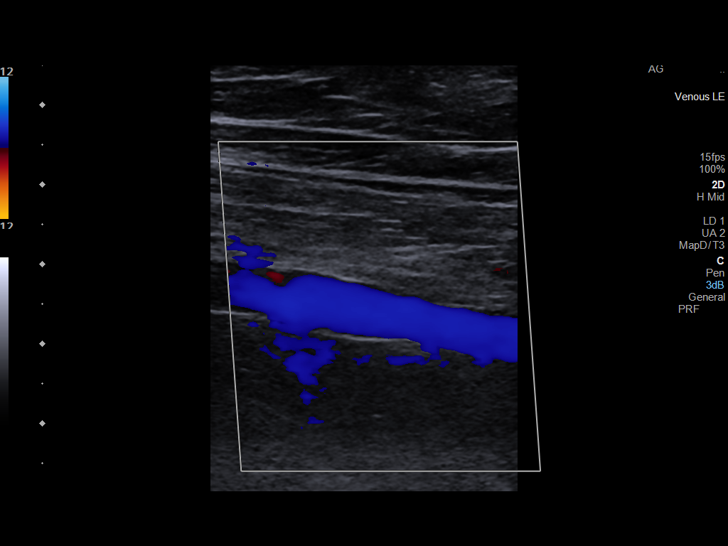
[im 57/73]
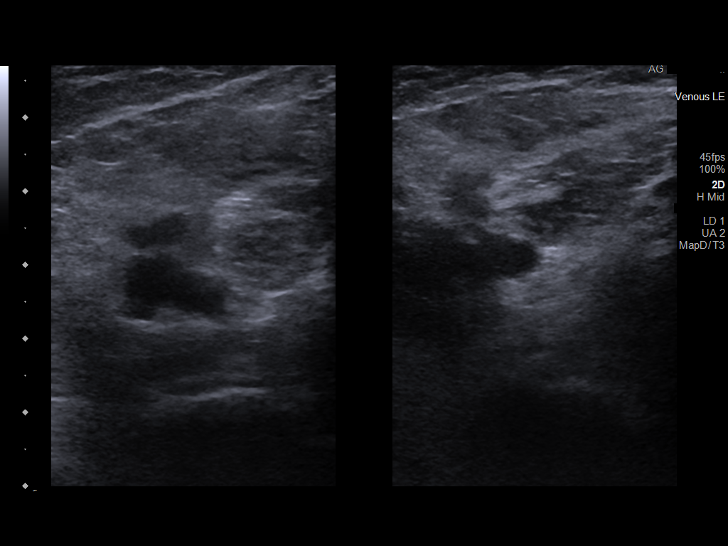
[im 60/73]
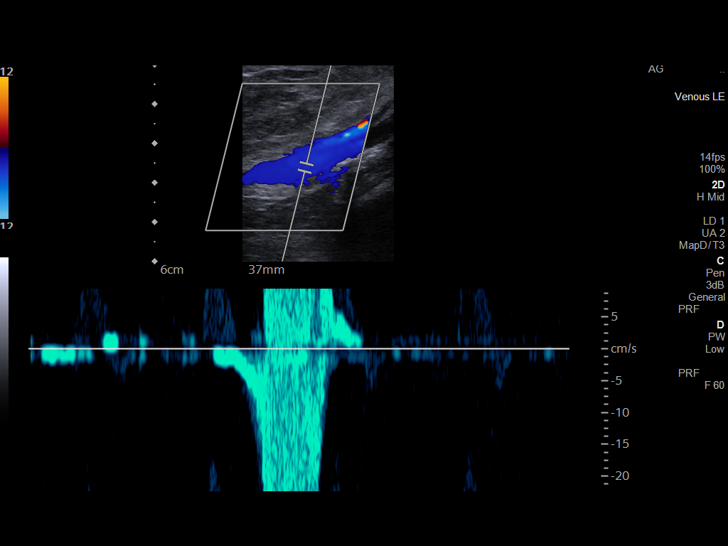
[im 66/73]
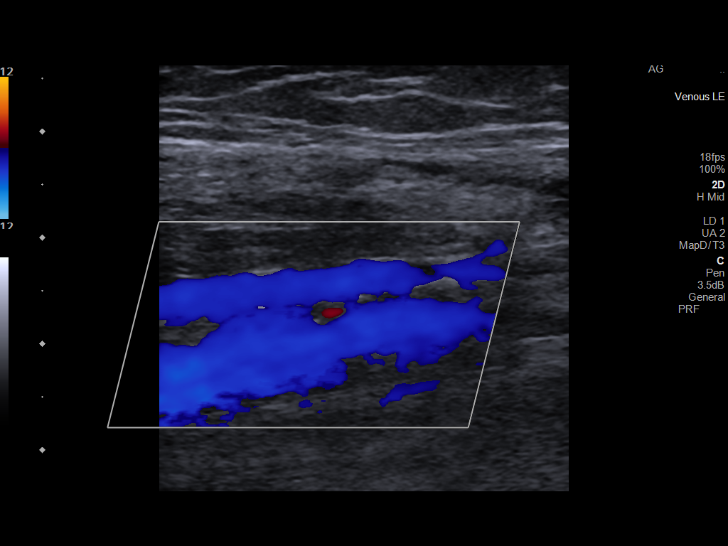
[im 73/73]
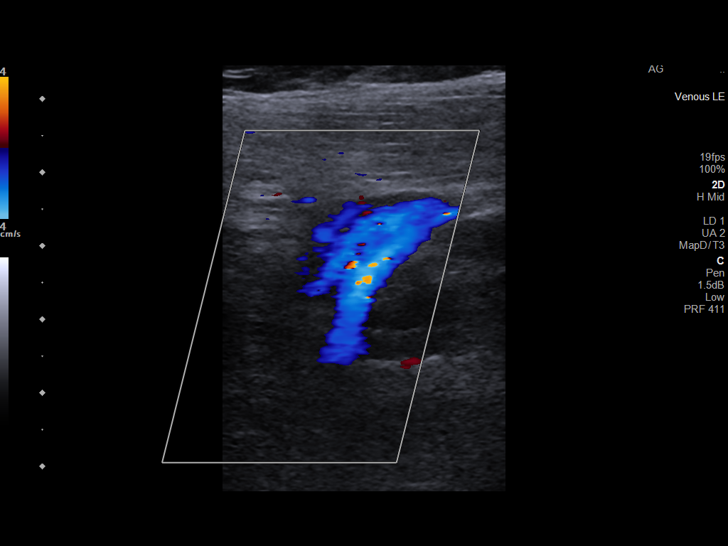

[14 of 24 positions shown; findings below may reference images not displayed]

FINDINGS: VENOUS

Normal compressibility of the common femoral, superficial femoral,
and popliteal veins, as well as the visualized calf veins.
Visualized portions of profunda femoral vein and great saphenous
vein unremarkable. No filling defects to suggest DVT on grayscale or
color Doppler imaging. Doppler waveforms show normal direction of
venous flow, normal respiratory plasticity and response to
augmentation.

Limited views of the contralateral common femoral vein are
unremarkable.

OTHER

None.

Limitations: none
IMPRESSION: Negative examination for deep venous thrombosis in the bilateral
lower extremities.
